# Patient Record
Sex: Female | Born: 1950 | Race: White | Hispanic: No | Marital: Married | Smoking: Former smoker
Health system: Southern US, Community
[De-identification: ages and names within clinical notes are randomized; demographics above are authoritative.]

## PROBLEM LIST (undated history)

## (undated) DIAGNOSIS — E890 Postprocedural hypothyroidism: Secondary | ICD-10-CM

## (undated) DIAGNOSIS — G4730 Sleep apnea, unspecified: Secondary | ICD-10-CM

## (undated) DIAGNOSIS — Z9889 Other specified postprocedural states: Secondary | ICD-10-CM

## (undated) DIAGNOSIS — M170 Bilateral primary osteoarthritis of knee: Secondary | ICD-10-CM

## (undated) DIAGNOSIS — J189 Pneumonia, unspecified organism: Secondary | ICD-10-CM

## (undated) DIAGNOSIS — M1711 Unilateral primary osteoarthritis, right knee: Secondary | ICD-10-CM

## (undated) DIAGNOSIS — Z85850 Personal history of malignant neoplasm of thyroid: Secondary | ICD-10-CM

## (undated) DIAGNOSIS — E079 Disorder of thyroid, unspecified: Secondary | ICD-10-CM

## (undated) DIAGNOSIS — R112 Nausea with vomiting, unspecified: Secondary | ICD-10-CM

## (undated) DIAGNOSIS — Z5189 Encounter for other specified aftercare: Secondary | ICD-10-CM

## (undated) DIAGNOSIS — D508 Other iron deficiency anemias: Secondary | ICD-10-CM

## (undated) DIAGNOSIS — Z974 Presence of external hearing-aid: Secondary | ICD-10-CM

## (undated) HISTORY — PX: BREAST CYST ASPIRATION: SHX578

## (undated) HISTORY — DX: Encounter for other specified aftercare: Z51.89

## (undated) HISTORY — DX: Disorder of thyroid, unspecified: E07.9

## (undated) HISTORY — DX: Other iron deficiency anemias: D50.8

## (undated) HISTORY — DX: Sleep apnea, unspecified: G47.30

## (undated) HISTORY — PX: BELPHAROPTOSIS REPAIR: SHX369

---

## 1996-09-26 HISTORY — PX: LAPAROSCOPIC CHOLECYSTECTOMY: SUR755

## 2012-06-30 ENCOUNTER — Ambulatory Visit: Payer: Self-pay | Admitting: Bariatrics

## 2012-07-07 ENCOUNTER — Ambulatory Visit: Payer: Self-pay | Admitting: Bariatrics

## 2012-07-07 DIAGNOSIS — Z01810 Encounter for preprocedural cardiovascular examination: Secondary | ICD-10-CM

## 2012-07-07 LAB — CBC WITH DIFFERENTIAL/PLATELET
Basophil #: 0.1 10*3/uL (ref 0.0–0.1)
Basophil %: 1 %
HCT: 37 % (ref 35.0–47.0)
Lymphocyte %: 20.8 %
MCH: 29.7 pg (ref 26.0–34.0)
MCHC: 34 g/dL (ref 32.0–36.0)
Monocyte #: 0.3 x10 3/mm (ref 0.2–0.9)
Neutrophil #: 3.7 10*3/uL (ref 1.4–6.5)
Platelet: 272 10*3/uL (ref 150–440)
RBC: 4.24 10*6/uL (ref 3.80–5.20)
RDW: 13.8 % (ref 11.5–14.5)

## 2012-07-07 LAB — COMPREHENSIVE METABOLIC PANEL
Alkaline Phosphatase: 104 U/L (ref 50–136)
Calcium, Total: 9.6 mg/dL (ref 8.5–10.1)
Co2: 24 mmol/L (ref 21–32)
Creatinine: 0.63 mg/dL (ref 0.60–1.30)
EGFR (African American): >60
EGFR (Non-African Amer.): >60
Glucose: 104 mg/dL — ABNORMAL HIGH (ref 65–99)
Potassium: 3.9 mmol/L (ref 3.5–5.1)
SGOT(AST): 16 U/L (ref 15–37)
SGPT (ALT): 20 U/L (ref 12–78)
Sodium: 142 mmol/L (ref 136–145)
Total Protein: 7.5 g/dL (ref 6.4–8.2)

## 2012-07-07 LAB — FOLATE: Folic Acid: 9.7 ng/mL (ref 3.1–100.0)

## 2012-07-07 LAB — BILIRUBIN, DIRECT: Bilirubin, Direct: 0.2 mg/dL (ref 0.00–0.20)

## 2012-07-07 LAB — IRON AND TIBC
Iron Saturation: 22 %
Iron: 63 ug/dL (ref 50–170)
Unbound Iron-Bind.Cap.: 226 ug/dL

## 2012-07-07 LAB — FERRITIN: Ferritin (ARMC): 152 ng/mL (ref 8–388)

## 2012-07-07 LAB — PROTIME-INR: INR: 1

## 2012-07-07 LAB — MAGNESIUM: Magnesium: 2 mg/dL

## 2012-07-07 LAB — LIPASE, BLOOD: Lipase: 81 U/L (ref 73–393)

## 2012-07-07 LAB — AMYLASE: Amylase: 32 U/L (ref 25–115)

## 2012-07-07 LAB — APTT: Activated PTT: 32.5 secs (ref 23.6–35.9)

## 2012-07-27 ENCOUNTER — Ambulatory Visit: Payer: Self-pay | Admitting: Bariatrics

## 2012-08-26 ENCOUNTER — Ambulatory Visit: Payer: Self-pay | Admitting: Bariatrics

## 2012-09-26 ENCOUNTER — Ambulatory Visit: Payer: Self-pay | Admitting: Bariatrics

## 2012-09-26 HISTORY — PX: ROUX-EN-Y GASTRIC BYPASS: SHX1104

## 2012-09-26 HISTORY — PX: BARIATRIC SURGERY: SHX1103

## 2012-10-14 ENCOUNTER — Ambulatory Visit: Payer: Self-pay | Admitting: Bariatrics

## 2012-10-19 ENCOUNTER — Inpatient Hospital Stay: Payer: Self-pay | Admitting: Bariatrics

## 2012-10-20 LAB — BASIC METABOLIC PANEL
BUN: 8 mg/dL (ref 7–18)
Calcium, Total: 9.7 mg/dL (ref 8.5–10.1)
Creatinine: 0.7 mg/dL (ref 0.60–1.30)
EGFR (Non-African Amer.): >60
Glucose: 111 mg/dL — ABNORMAL HIGH (ref 65–99)
Osmolality: 280 (ref 275–301)
Potassium: 3.7 mmol/L (ref 3.5–5.1)
Sodium: 141 mmol/L (ref 136–145)

## 2012-10-20 LAB — CBC WITH DIFFERENTIAL/PLATELET
Basophil #: 0 10*3/uL (ref 0.0–0.1)
Basophil %: 0.1 %
Eosinophil #: 0 10*3/uL (ref 0.0–0.7)
Eosinophil %: 0 %
HCT: 32.7 % — ABNORMAL LOW (ref 35.0–47.0)
HGB: 11.3 g/dL — ABNORMAL LOW (ref 12.0–16.0)
Lymphocyte #: 0.5 10*3/uL — ABNORMAL LOW (ref 1.0–3.6)
Lymphocyte %: 4 %
MCH: 29.8 pg (ref 26.0–34.0)
MCHC: 34.4 g/dL (ref 32.0–36.0)
MCV: 87 fL (ref 80–100)
Monocyte %: 3 %
Neutrophil #: 11.3 10*3/uL — ABNORMAL HIGH (ref 1.4–6.5)
Neutrophil %: 92.9 %
Platelet: 260 10*3/uL (ref 150–440)
RBC: 3.78 10*6/uL — ABNORMAL LOW (ref 3.80–5.20)
RDW: 13.9 % (ref 11.5–14.5)
WBC: 12.2 10*3/uL — ABNORMAL HIGH (ref 3.6–11.0)

## 2012-10-20 LAB — MAGNESIUM: Magnesium: 2 mg/dL

## 2012-10-21 LAB — PATHOLOGY REPORT

## 2012-11-16 ENCOUNTER — Ambulatory Visit: Payer: Self-pay | Admitting: Bariatrics

## 2013-04-14 ENCOUNTER — Other Ambulatory Visit: Payer: Self-pay | Admitting: Bariatrics

## 2013-04-14 LAB — COMPREHENSIVE METABOLIC PANEL
Albumin: 3.9 g/dL (ref 3.4–5.0)
Anion Gap: 6 — ABNORMAL LOW (ref 7–16)
BUN: 11 mg/dL (ref 7–18)
Bilirubin,Total: 0.7 mg/dL (ref 0.2–1.0)
Co2: 26 mmol/L (ref 21–32)
Creatinine: 0.65 mg/dL (ref 0.60–1.30)
EGFR (African American): >60
EGFR (Non-African Amer.): >60
Glucose: 95 mg/dL (ref 65–99)
Potassium: 3.8 mmol/L (ref 3.5–5.1)
SGOT(AST): 23 U/L (ref 15–37)
Sodium: 140 mmol/L (ref 136–145)
Total Protein: 7.4 g/dL (ref 6.4–8.2)

## 2013-04-14 LAB — MAGNESIUM: Magnesium: 2.1 mg/dL

## 2013-04-14 LAB — CBC WITH DIFFERENTIAL/PLATELET
Basophil %: 0.7 %
Eosinophil #: 0.1 10*3/uL (ref 0.0–0.7)
Eosinophil %: 1.3 %
HGB: 12.6 g/dL (ref 12.0–16.0)
Lymphocyte %: 16.1 %
MCH: 29.2 pg (ref 26.0–34.0)
MCHC: 33.4 g/dL (ref 32.0–36.0)
MCV: 88 fL (ref 80–100)
Monocyte %: 7.6 %
Neutrophil #: 4.4 10*3/uL (ref 1.4–6.5)
RBC: 4.3 10*6/uL (ref 3.80–5.20)
RDW: 15.5 % — ABNORMAL HIGH (ref 11.5–14.5)
WBC: 6 10*3/uL (ref 3.6–11.0)

## 2013-04-14 LAB — AMYLASE: Amylase: 35 U/L (ref 25–115)

## 2013-09-09 ENCOUNTER — Ambulatory Visit: Payer: Self-pay | Admitting: Obstetrics and Gynecology

## 2013-10-12 ENCOUNTER — Other Ambulatory Visit: Payer: Self-pay | Admitting: Bariatrics

## 2013-10-12 LAB — AMYLASE: Amylase: 40 U/L (ref 25–115)

## 2013-10-12 LAB — COMPREHENSIVE METABOLIC PANEL
ALT: 42 U/L (ref 12–78)
ANION GAP: 5 — AB (ref 7–16)
Albumin: 3.7 g/dL (ref 3.4–5.0)
Alkaline Phosphatase: 91 U/L
BILIRUBIN TOTAL: 0.6 mg/dL (ref 0.2–1.0)
BUN: 18 mg/dL (ref 7–18)
CALCIUM: 9.8 mg/dL (ref 8.5–10.1)
CHLORIDE: 105 mmol/L (ref 98–107)
CO2: 29 mmol/L (ref 21–32)
Creatinine: 0.68 mg/dL (ref 0.60–1.30)
Glucose: 82 mg/dL (ref 65–99)
OSMOLALITY: 279 (ref 275–301)
Potassium: 3.6 mmol/L (ref 3.5–5.1)
SGOT(AST): 32 U/L (ref 15–37)
Sodium: 139 mmol/L (ref 136–145)
TOTAL PROTEIN: 6.8 g/dL (ref 6.4–8.2)

## 2013-10-12 LAB — CBC WITH DIFFERENTIAL/PLATELET
Basophil #: 0 10*3/uL (ref 0.0–0.1)
Basophil %: 0.8 %
EOS PCT: 0.9 %
Eosinophil #: 0 10*3/uL (ref 0.0–0.7)
HCT: 38.1 % (ref 35.0–47.0)
HGB: 12.5 g/dL (ref 12.0–16.0)
LYMPHS ABS: 0.9 10*3/uL — AB (ref 1.0–3.6)
LYMPHS PCT: 22.6 %
MCH: 30.2 pg (ref 26.0–34.0)
MCHC: 32.7 g/dL (ref 32.0–36.0)
MCV: 92 fL (ref 80–100)
Monocyte #: 0.3 x10 3/mm (ref 0.2–0.9)
Monocyte %: 6.6 %
NEUTROS ABS: 2.8 10*3/uL (ref 1.4–6.5)
Neutrophil %: 69.1 %
PLATELETS: 188 10*3/uL (ref 150–440)
RBC: 4.13 10*6/uL (ref 3.80–5.20)
RDW: 15.1 % — AB (ref 11.5–14.5)
WBC: 4.1 10*3/uL (ref 3.6–11.0)

## 2013-10-12 LAB — IRON AND TIBC
IRON SATURATION: 32 %
IRON: 73 ug/dL (ref 50–170)
Iron Bind.Cap.(Total): 230 ug/dL — ABNORMAL LOW (ref 250–450)
Unbound Iron-Bind.Cap.: 157 ug/dL

## 2013-10-12 LAB — FOLATE: FOLIC ACID: 41.3 ng/mL (ref 3.1–100.0)

## 2013-10-12 LAB — PHOSPHORUS: Phosphorus: 2.4 mg/dL — ABNORMAL LOW (ref 2.5–4.9)

## 2013-10-12 LAB — FERRITIN: FERRITIN (ARMC): 172 ng/mL (ref 8–388)

## 2013-10-12 LAB — MAGNESIUM: MAGNESIUM: 2.3 mg/dL

## 2013-12-27 HISTORY — PX: TOTAL VAGINAL HYSTERECTOMY: SHX2548

## 2014-01-05 ENCOUNTER — Ambulatory Visit: Payer: Self-pay | Admitting: Obstetrics and Gynecology

## 2014-01-05 LAB — BASIC METABOLIC PANEL
ANION GAP: 0 — AB (ref 7–16)
BUN: 24 mg/dL — ABNORMAL HIGH (ref 7–18)
CREATININE: 0.62 mg/dL (ref 0.60–1.30)
Calcium, Total: 10.2 mg/dL — ABNORMAL HIGH (ref 8.5–10.1)
Chloride: 107 mmol/L (ref 98–107)
Co2: 33 mmol/L — ABNORMAL HIGH (ref 21–32)
EGFR (Non-African Amer.): >60
GLUCOSE: 84 mg/dL (ref 65–99)
Osmolality: 283 (ref 275–301)
Potassium: 4.6 mmol/L (ref 3.5–5.1)
Sodium: 140 mmol/L (ref 136–145)

## 2014-01-05 LAB — CBC
HCT: 37.3 % (ref 35.0–47.0)
HGB: 12 g/dL (ref 12.0–16.0)
MCH: 30.4 pg (ref 26.0–34.0)
MCHC: 32.2 g/dL (ref 32.0–36.0)
MCV: 94 fL (ref 80–100)
PLATELETS: 215 10*3/uL (ref 150–440)
RBC: 3.96 10*6/uL (ref 3.80–5.20)
RDW: 13.9 % (ref 11.5–14.5)
WBC: 5 10*3/uL (ref 3.6–11.0)

## 2014-01-11 ENCOUNTER — Ambulatory Visit: Payer: Self-pay | Admitting: Obstetrics and Gynecology

## 2014-01-11 LAB — HEMATOCRIT: HCT: 35.8 % (ref 35.0–47.0)

## 2014-01-12 LAB — CBC WITH DIFFERENTIAL/PLATELET
Basophil #: 0 10*3/uL (ref 0.0–0.1)
Basophil %: 0.2 %
EOS ABS: 0 10*3/uL (ref 0.0–0.7)
Eosinophil %: 0.1 %
HCT: 28.8 % — ABNORMAL LOW (ref 35.0–47.0)
HGB: 9.6 g/dL — ABNORMAL LOW (ref 12.0–16.0)
Lymphocyte #: 0.9 10*3/uL — ABNORMAL LOW (ref 1.0–3.6)
Lymphocyte %: 7.3 %
MCH: 31.1 pg (ref 26.0–34.0)
MCHC: 33.5 g/dL (ref 32.0–36.0)
MCV: 93 fL (ref 80–100)
Monocyte #: 0.9 x10 3/mm (ref 0.2–0.9)
Monocyte %: 7.6 %
Neutrophil #: 9.9 10*3/uL — ABNORMAL HIGH (ref 1.4–6.5)
Neutrophil %: 84.8 %
Platelet: 199 10*3/uL (ref 150–440)
RBC: 3.09 10*6/uL — AB (ref 3.80–5.20)
RDW: 14.2 % (ref 11.5–14.5)
WBC: 11.6 10*3/uL — ABNORMAL HIGH (ref 3.6–11.0)

## 2014-01-12 LAB — BASIC METABOLIC PANEL
ANION GAP: 6 — AB (ref 7–16)
BUN: 18 mg/dL (ref 7–18)
Calcium, Total: 9.1 mg/dL (ref 8.5–10.1)
Chloride: 108 mmol/L — ABNORMAL HIGH (ref 98–107)
Co2: 26 mmol/L (ref 21–32)
Creatinine: 1.08 mg/dL (ref 0.60–1.30)
EGFR (Non-African Amer.): 55 — ABNORMAL LOW
Glucose: 155 mg/dL — ABNORMAL HIGH (ref 65–99)
Osmolality: 284 (ref 275–301)
Potassium: 4.9 mmol/L (ref 3.5–5.1)
SODIUM: 140 mmol/L (ref 136–145)

## 2014-01-13 LAB — BASIC METABOLIC PANEL
ANION GAP: 2 — AB (ref 7–16)
BUN: 18 mg/dL (ref 7–18)
CALCIUM: 9.6 mg/dL (ref 8.5–10.1)
CHLORIDE: 107 mmol/L (ref 98–107)
CO2: 30 mmol/L (ref 21–32)
CREATININE: 1.26 mg/dL (ref 0.60–1.30)
EGFR (African American): 53 — ABNORMAL LOW
EGFR (Non-African Amer.): 46 — ABNORMAL LOW
Glucose: 103 mg/dL — ABNORMAL HIGH (ref 65–99)
OSMOLALITY: 280 (ref 275–301)
Potassium: 4.1 mmol/L (ref 3.5–5.1)
Sodium: 139 mmol/L (ref 136–145)

## 2014-01-15 LAB — PATHOLOGY REPORT

## 2014-04-19 ENCOUNTER — Ambulatory Visit: Payer: Self-pay | Admitting: Surgical

## 2014-04-19 LAB — CBC WITH DIFFERENTIAL/PLATELET
Basophil #: 0 10*3/uL (ref 0.0–0.1)
Basophil %: 1.1 %
EOS ABS: 0 10*3/uL (ref 0.0–0.7)
EOS PCT: 1.3 %
HCT: 36.7 % (ref 35.0–47.0)
HGB: 12 g/dL (ref 12.0–16.0)
LYMPHS ABS: 0.9 10*3/uL — AB (ref 1.0–3.6)
Lymphocyte %: 26 %
MCH: 30.8 pg (ref 26.0–34.0)
MCHC: 32.8 g/dL (ref 32.0–36.0)
MCV: 94 fL (ref 80–100)
MONO ABS: 0.2 x10 3/mm (ref 0.2–0.9)
MONOS PCT: 6.4 %
NEUTROS ABS: 2.2 10*3/uL (ref 1.4–6.5)
NEUTROS PCT: 65.2 %
Platelet: 181 10*3/uL (ref 150–440)
RBC: 3.91 10*6/uL (ref 3.80–5.20)
RDW: 13.3 % (ref 11.5–14.5)
WBC: 3.4 10*3/uL — ABNORMAL LOW (ref 3.6–11.0)

## 2014-04-19 LAB — COMPREHENSIVE METABOLIC PANEL
ALK PHOS: 94 U/L
Albumin: 3.7 g/dL (ref 3.4–5.0)
Anion Gap: 6 — ABNORMAL LOW (ref 7–16)
BUN: 18 mg/dL (ref 7–18)
Bilirubin,Total: 0.5 mg/dL (ref 0.2–1.0)
CALCIUM: 9.7 mg/dL (ref 8.5–10.1)
Chloride: 108 mmol/L — ABNORMAL HIGH (ref 98–107)
Co2: 28 mmol/L (ref 21–32)
Creatinine: 0.62 mg/dL (ref 0.60–1.30)
EGFR (Non-African Amer.): >60
Glucose: 87 mg/dL (ref 65–99)
OSMOLALITY: 284 (ref 275–301)
Potassium: 4.2 mmol/L (ref 3.5–5.1)
SGOT(AST): 34 U/L (ref 15–37)
SGPT (ALT): 66 U/L — ABNORMAL HIGH
Sodium: 142 mmol/L (ref 136–145)
Total Protein: 7 g/dL (ref 6.4–8.2)

## 2014-04-19 LAB — PHOSPHORUS: Phosphorus: 2.8 mg/dL (ref 2.5–4.9)

## 2014-04-19 LAB — FOLATE: FOLIC ACID: 30.3 ng/mL — AB (ref 3.1–17.5)

## 2014-04-19 LAB — MAGNESIUM: Magnesium: 2.1 mg/dL

## 2014-04-19 LAB — AMYLASE: Amylase: 42 U/L (ref 25–115)

## 2014-04-19 LAB — FERRITIN: Ferritin (ARMC): 59 ng/mL (ref 8–388)

## 2014-04-19 LAB — IRON: Iron: 64 ug/dL (ref 50–170)

## 2014-04-20 ENCOUNTER — Ambulatory Visit: Payer: Self-pay | Admitting: Bariatrics

## 2014-08-18 NOTE — Op Note (Signed)
PATIENT NAME:  Tracey Jensen, Tracey Jensen MR#:  [id number] DATE OF BIRTH:  [birth year]  DATE OF PROCEDURE:  10/19/2012  PROCEDURE PERFORMED: Laparoscopic gastric bypass with intraoperative endoscopy.  PREOPERATIVE DIAGNOSIS: Body mass index of 53 associated with obstructive sleep apnea.   POSTOPERATIVE DIAGNOSIS: Body mass index of 53 associated with obstructive sleep apnea.   PHYSICIANS IN ATTENDANCE: Casimiro Needle A. Alva Garnet, MD.  ASSISTANTS: Lewis Moccasin, PA-C. Lollie Sails was consulted to assist as I was unable to obtain assistance from my routine PA.   DESCRIPTION OF PROCEDURE: The patient was brought to the operating room and placed in supine position. General anesthesia obtained with orotracheal intubation. Foley catheter inserted sterilely. TED hose and Thromboguards applied. A footboard applied at the end of the operative bed. The abdomen was sterilely prepped and draped. A 5 mm trocar introduced under direct visualization in the left upper quadrant of the abdomen. Pneumoperitoneum obtained with carbon dioxide. Four additional trocars were introduced across the upper abdomen. The patient had elevation of the omentum, which was then divided in the mid transverse colon area. The ligament of Treitz was identified, and the small bowel was followed distally 50 cm, at which point the small bowel was introduced into the left upper quadrant and secured to the anterior gastric wall for future use. The patient then had elevation of the left lobe of the liver. The patient, on inspection, failed to have any identifiable hiatal hernia. A portion of the fat pad overlying the angle of His was mobilized from both the anterior stomach and the undersurface of the left hemidiaphragm. The fundus was also mobilized from the left hemidiaphragm. Again, no hiatal hernia identified. The patient had division of the arcade vessels approximately 5 cm inferior to the GE junction. This was done along the lesser curvature of the stomach with division of  the arcade vessels by use of the Harmonic scalpel. The division of the proximal gastric pouch was then performed with use of gold load GIA stapler reinforced with Seamguard. The first line of staples was placed in a transverse direction. Next, a vertical line of staples was placed parallel to the lesser curvature and brought out just lateral to the angle of His, creating a relatively small gastric pouch. The mobilized jejunum was then secured to the posterior stomach wall and enterotomy made on the distal posterior aspect of the gastric pouch and opposing portion of the antimesenteric border of the jejunum. A blue load stapler was fired at the 2 cm mark to initiate creation of a proximal gastric pouch. The resulting enterotomy was then closed with a running 2-0 Polysorb suture. The suture and staple line reinforced with an additional running 2-0 Polysorb suture. This latter was performed with a 34 French bougie in place used as an aid in sizing of the anastomotic lumen. Immediately proximal to this, the patient had entry into the mesentery immediately adjacent to the small bowel, and a white load stapler was used to transect the jejunum, creating a biliary limb. The Roux limb was then followed distally 125 cm, at which point a side-to-side jejunal/jejunal anastomosis was configured. This was accomplished with enterotomies on the antimesenteric border of the biliary limb and on the common channel portion of the jejunum. There was a side entry into the mucosa made adjacent to the initial entry site into the common channel. A small portion of mucosa was excised. A white load stapler was used to create a common lumen at this point, and the resulting enterotomy closed with a repeat  firing of white load GIA stapler. On inspection, there was a relative narrowing of the jejunum leading into the anastomosis presumably because of this small ellipse of mucosa excised, and while there appeared to be adequate spacing on the  posterior aspect of the jejunum, it was my concern this might represent a relative obstruction, and it was felt that this anastomosis could potentially lead to dysfunction in the future, and it was decided to resect this. The jejunum immediately proximal to the jejunal/jejunal anastomosis was transected with a white load stapler, the arcade vessels associated with the area of the anastomosis divided with the use of the Harmonic scalpel, and the bowel immediately distal to it was transected as well, leaving a small segment of bowel to be removed. A side-to-side anastomosis was then created with the Roux limb in the bowel more distal to this, and was done with again a white load GIA stapler, creating a common lumen, and closure of the resulting enterotomy with a repeat firing of white load stapler. The small arcade mesenteric window was closed with a running 2-0 Surgidac suture. The biliary limb addressed, and again a small aspect of the bowel was resected after division of the arcade vessels with use of the Harmonic scalpel. This included full exclusion of the initial stapled anastomosis. Next, a new biliary jejunal anastomosis was created. This was performed approximately 10 cm distal to the initial anastomosis. This was accomplished in a similar fashion with an enterotomy on the antimesenteric border of the biliary limb and on the common channel portion of jejunum. A common lumen created with a white load GIA stapler, followed by closure of the enterotomy with a repeat firing of white load stapler. Anti-torsion sutures placed distal to the anastomosis, and the mesenteric window closed with a running 2-0 Surgidac suture. At this point, the patient had occlusion of the jejunum distal gastrojejunal anastomosis and endoscopy performed. There was no evidence of an air leak noted in the region of the anastomosis. The suture line covered with Enseal protein glue and then covered with omental patch, with delivery of the 2  divided limbs of the omentum over the area of the anastomosis. This was secured to the jejunum immediately distal to the anastomosis. Peterson defect closed with a running 2-0 Surgidac suture. The anastomoses inspected and hemostasis confirmed. The pneumoperitoneum relieved, the trocars removed. The wounds injected with 0.25% Marcaine, followed by 4-0 Monocryl to the dermis, followed by Dermabond. The patient was allowed to recover, having tolerated the procedure well with minimal blood loss.   ____________________________ Tyrone AppleMichael A. Alva Garnetyner, MD mat:OSi D: 10/20/2012 10:02:00 ET T: 10/20/2012 11:27:12 ET JOB#: [id number]  cc: Casimiro NeedleMichael A. Alva Garnetyner, MD, <Dictator> Everette RankMICHAEL A Peggyann Zwiefelhofer MD ELECTRONICALLY SIGNED 11/16/2012 15:44

## 2014-08-19 NOTE — Op Note (Signed)
PATIENT NAME:  Tracey Jensen, Tracey Jensen MR#:  [id number] DATE OF BIRTH:  [birth year]  DATE OF PROCEDURE:  01/11/2014   PREOPERATIVE DIAGNOSIS:  Procidentia.   POSTOPERATIVE DIAGNOSES:  1.  Procidentia.  2.  Periurethral abscess.  3.  Bilateral hydronephrosis.   OPERATIVE PROCEDURES:  1.  Transvaginal hysterectomy, bilateral salpingo-oophorectomy.  2.  Anterior, posterior colporrhaphy.  3.  Cystoscopy with ureteral stent placement under fluoroscopy (Dr. Apolinar Junes). 4.  Pubovaginal sling, aborted (Dr. Apolinar Junes).    INDICATIONS: The patient is a 64 year old white female with procidentia who presents for definitive surgical management.   FINDINGS AT SURGERY:  Included procidentia with total vault eversion.  On cystoscopy, anatomy distorted due to prolapse.  Bilateral hydronephrosis was noted during ureteral stent placement. A 2 cm right periurethral abscess was identified during dissection for pubovaginal sling placement. Because of purulent drainage from the cyst, the procedure was aborted in order to avoid placement of graft tissue into a potentially infected area.   DESCRIPTION OF PROCEDURE: The patient was brought to the Operating Room where she was placed in the supine position. General endotracheal anesthesia was induced without difficulty. She was placed in the dorsal lithotomy position using the bumblebee stirrups. The cystoscopy was performed and ureteral stent placement was done bilaterally under fluoroscopic guidance. Please see Dr. Delana Meyer note for details.  Following placement of the stents and a Foley catheter, the patient was repositioned and re-prepped and draped in standard fashion. The patient was placed in the candy cane stirrups for the vaginal surgery.  A weighted speculum was placed into the vagina.  A double-tooth tenaculum was placed onto the cervix and a posterior colpotomy was attempted with Mayo scissors.  The initial attempt at gaining entry to the cul-de-sac was unsuccessful due to  anatomic distortion.  Gradually with the continuing procedure, colpotomy was made.  The uterosacral ligaments were clamped, cut, and stick tied using 0 Vicryl suture.  The cervix was circumscribed with the Bovie cautery and the bladder was dissected off the lower uterine segment through sharp and blunt dissection.  Eventually, the anterior colpotomy was made.  Sequentially the cardinal broad ligament complexes were clamped, cut, and stick tied using 0 Vicryl sutures.  This was done bilaterally up to the level of the utero-ovarian ligaments which were then crossclamped, cut, and stick tied.  The adnexa were identified respectively and grasped with Babcock clamps.  The infundibulopelvic ligament was isolated and crossclamped using curved Heaney clamp.  The tube and ovary were excised from the operative field.  A free tie was placed on the pedicle, followed by a stick tie of 0 Vicryl suture. Good hemostasis was obtained.  A similar procedure was carried out on the contralateral adnexa. The cul-de-sac was then closed using a pursestring stitch of 0 Vicryl suture. This was followed by the anterior colporrhaphy procedure.   The angles of the vagina were grasped with Allis clamps.  The vaginal mucosa was undermined using Metzenbaum scissors and incised in the midline.  The Allis-Adair retractors were used to facilitate grasping of the vaginal mucosa.  This was then dissected off the pubovesical cervical fascia through sharp and blunt dissection.  The dissection was very tedious due to scarring from the chronic relaxation process.  Eventually the bladder was mobilized from the vaginal mucosa. The cystocele was reduced using 0 Vicryl sutures in a vertical mattress technique.  The cystocele was reduced nicely.  The vaginal mucosa was trimmed with a moderate amount of tissue being removed.  The vaginal mucosa  was then reapproximated in the midline using 2-0 chromic sutures in a simple interrupted manner.  Following  completion of the cystocele repair, attention was turned to the pubovaginal sling.   Please see details of the pubovaginal sling surgery done by Dr. Apolinar JunesBrandon. The procedure was aborted because of identification of a periurethral cystic abscess. The vaginal mucosa was closed.   Posterior colporrhaphy was then performed in standard fashion. Allis clamps were used to grasp the lateral aspects of the vaginal mucosa at the introitus posteriorly.  A diamond-shaped wedge of tissue from the perineum was excised using a scalpel.  The posterior vaginal mucosa was then undermined with Metzenbaum scissors and then incised in the midline.  Allis-Adair retractors were used to facilitate exposure.  The perirectal fascia was dissected off of the vaginal mucosa to expose the rectocele.  The rectocele was then closed using horizontal mattress sutures of 0 Vicryl.  A good shelf was developed.  During this process however, the peritoneum was entered, this was explored, and no adhesions were identified on palpation and the peritoneal cavity was then closed again with a pursestring suture of 0 Vicryl.  The remainder of the rectocele was completed using interrupted sutures of 2-0 Vicryl in a horizontal mattress technique.  Excess vaginal mucosa was trimmed and the vagina was closed using 2-0 chromic suture in a simple interrupted manner.  The perineum, including the perineal body, was reapproximated using 0 Vicryl sutures.  This was followed by the closure of the posterior fourchette with simple interrupted sutures of 2-0 chromic.  Depth of the vagina was noted to be adequate.  Vaginal mucosa, although trimmed, was redundant with a slight excess.  However, this was not removed because of concern for foreshortening of the vagina.  A vaginal pack was placed with Premarin cream used as lubricant.  The patient was then awakened, extubated, mobilized, and taken to the recovery room in satisfactory condition.  IV fluids infused were 2 liters  of crystalloid. The patient also received 1 unit of packed red blood cells.   ESTIMATED BLOOD LOSS: 1200 mL.   URINE OUTPUT: 900 mL.  All instruments, needle, and sponge counts were verified as correct.  The patient did receive Ancef antibiotic prophylaxis x 2 doses due to the prolonged duration of the procedure.  No complications were encountered.      ____________________________ Prentice DockerMartin A. Laiba Fuerte, MD mad:DT D: 01/13/2014 13:00:40 ET T: 01/13/2014 13:54:58 ET JOB#: [id number]  cc: Daphine DeutscherMartin A. Laqueshia Cihlar, MD, <Dictator> [facility] Prentice DockerMARTIN A Hailei Besser MD ELECTRONICALLY SIGNED 01/16/2014 7:56

## 2014-08-19 NOTE — Op Note (Signed)
PATIENT NAME:  Tracey Jensen, Tracey Jensen MR#:  [id number] DATE OF BIRTH:  [birth year]  DATE OF PROCEDURE:  01/11/2014  PREOPERATIVE DIAGNOSES:  Pelvic organ prolapse, stress urinary incontinence.  POSTOPERATIVE DIAGNOSES:  Pelvic organ prolapse, stress urinary incontinence.  PROCEDURES PERFORMED:  Cystoscopy, bilateral retrograde pyelogram, bilateral ureteral catheter stent placement, interpretation of fluoroscopy less than 30 minutes, attempted/ aborted pubovaginal sling placement [facility]).  ATTENDING SURGEON:  Sherlynn Stalls, MD  ANESTHESIA:  General anesthesia.  ESTIMATED BLOOD LOSS:  Please see dictated note from Dr. DeFrancesco/Cherry for details.   SPECIMEN:  From urologic portion of the procedure is none.   COMPLICATIONS:  Unable to place midurethral sling due to presence of purulent periurethral collection.  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 She was initially seen in consultation via [facility] Dr. Enzo Bi, who recommended transvaginal hysterectomy and salpingo-oophorectomy with primary anterior and posterior repairs. She was also seen in consultation by myself given her history of fairly significant stress urinary incontinence and counsel to undergo midurethral sling placement as a concomitant procedure. We discussed extensively the risks of mesh including risk of mesh infection, extrusion, erosion, urinary retention, and failure of the sling. She was also counseled to undergo cystoscopy with bilateral ureteral stent placement for assistance with identification of the ureters during this complex repair. She agreed to proceed with planned procedure.   PROCEDURE IN DETAIL:  The patient was correctly identified in the preoperative holding area, and informed consent was confirmed. She was brought to the operating suite and placed on the table in the supine position. At this time, a universal  timeout protocol was performed. All team members were identified. Venodyne boots were placed, and she was administered 2 grams of IV Ancef in the perioperative period. She was then repositioned lower on the bed to accommodate the C-arm and then repositioned in the dorsal lithotomy position. She was prepped and draped in standard surgical fashion.  At this point in time, a rigid cystoscope was advanced per urethra into the bladder. Of note, at this point in time her prolapse was not reduced, and there was significant distortion of her anatomy and her trigone was fairly difficult to identify. Upon reduction of her severe cystocele, the trigone was able to be visualized with a 30-degree lens.  There was some edema and mild inflammatory changes at the trigone noted. Attention was turned to the right ureteral orifice, which was cannulated using a 5-French open-ended ureteral orifice just within the UL. At this point in time, the C-arm was used to perform a retrograde pyelogram. This revealed deviation of the distal ureter as well as moderate hydroureteronephrosis. A wire was then passed up the ureter to the level of the renal pelvis, confirmed under fluoroscopy. The 5-French open-ended ureteral stent was advanced over this wire, up to the level of the proximal ureter. The wire was removed, leaving the open-ended stent in place. The same exact procedure was performed on the left-hand side, first cannulating the left UO with the 5-French open-ended ureteral catheter. A retrograde pyelogram was confirmed, again revealing moderate hydroureteronephrosis down to the level of the bladder with some distortion of the distal ureteral anatomy due to the profound prolapse of her bladder. The open-ended ureteral stent was then advanced over the wire to the level of the proximal ureter. When the scope was removed, the left stent was cut at a 45-degree angle to differentiate between the left and  right. A 15-French Foley catheter was  then passed into the bladder and the balloon was inflated with 10 mL of sterile water. Each ureteral catheter was then tied to the Foley using 0 silk ties. Bile bags were attached to each of the open-ended ureteral catheters to monitor urine output throughout the case.   At this point in time, the patient was repositioned and Dr. Enzo Bi performed a transvaginal hysterectomy and bilateral salpingo-oophorectomy as well as anterior and posterior repair, for which I was his assistant. Please see his dictation for this portion of the procedure.   Once the anterior anatomy had been repaired and reduced back into its normal anatomical position, I then attempted to place a pubovaginal sling using the [facility] kit. First, the urethral meatus was retracted slightly superiorly, and hydrodissection was performed at the level of the bladder neck using 1% lidocaine with epinephrine. Approximately, a 2 cm long incision was made below the urethral meatus in the midline. The dissection was then carried out in the tissue plane on the patient's left-hand side just lateral to the urethra, towards the pubic bone so the pubic bone was easily palpable. The same dissection and plane was then attempted on the right side; however, upon entering the space, a fluid pocket of purulent discharge was encountered, most likely representing an infected periurethral cyst or gland. Given the presence of purulent discharge at the site of the planned mesh placement, the decision was made not to proceed with mesh placement given concern for mesh infection. This area was then copiously irrigated after the abscess was completely drained. The vaginal mucosal incision was then closed using a series of interrupted 3-0 chromic sutures. At this point, Dr. Enzo Bi completed his portion of the procedure, and the patient was ultimately reversed from anesthesia and taken to the PACU in stable condition.    ____________________________ Sherlynn Stalls, MD ajb:nb D: 01/11/2014 21:18:46 ET T: 01/11/2014 21:58:42 ET JOB#: [id number]  cc: Sherlynn Stalls, MD, <Dictator> Sherlynn Stalls MD ELECTRONICALLY SIGNED 02/05/2014 8:39

## 2014-09-06 ENCOUNTER — Other Ambulatory Visit: Payer: Self-pay | Admitting: Obstetrics and Gynecology

## 2014-09-06 DIAGNOSIS — Z1231 Encounter for screening mammogram for malignant neoplasm of breast: Secondary | ICD-10-CM

## 2014-09-15 ENCOUNTER — Ambulatory Visit
Admission: RE | Admit: 2014-09-15 | Discharge: 2014-09-15 | Disposition: A | Discharge status: Home / Self Care | Payer: 59 | Source: Ambulatory Visit | Attending: Obstetrics and Gynecology | Admitting: Obstetrics and Gynecology

## 2014-09-15 DIAGNOSIS — Z1231 Encounter for screening mammogram for malignant neoplasm of breast: Secondary | ICD-10-CM | POA: Diagnosis not present

## 2014-10-03 ENCOUNTER — Ambulatory Visit (INDEPENDENT_AMBULATORY_CARE_PROVIDER_SITE_OTHER): Payer: 59 | Admitting: Family Medicine

## 2014-10-03 ENCOUNTER — Encounter: Payer: Self-pay | Admitting: Family Medicine

## 2014-10-03 VITALS — BP 122/74 | HR 110 | Temp 98.2°F | Resp 16 | Ht 66.0 in | Wt 166.2 lb

## 2014-10-03 DIAGNOSIS — Z90722 Acquired absence of ovaries, bilateral: Secondary | ICD-10-CM | POA: Diagnosis not present

## 2014-10-03 DIAGNOSIS — Z79890 Hormone replacement therapy (postmenopausal): Secondary | ICD-10-CM | POA: Diagnosis not present

## 2014-10-03 DIAGNOSIS — D508 Other iron deficiency anemias: Secondary | ICD-10-CM | POA: Diagnosis not present

## 2014-10-03 DIAGNOSIS — Z9079 Acquired absence of other genital organ(s): Secondary | ICD-10-CM

## 2014-10-03 DIAGNOSIS — K5909 Other constipation: Secondary | ICD-10-CM | POA: Insufficient documentation

## 2014-10-03 DIAGNOSIS — Z9884 Bariatric surgery status: Secondary | ICD-10-CM

## 2014-10-03 DIAGNOSIS — Z23 Encounter for immunization: Secondary | ICD-10-CM | POA: Diagnosis not present

## 2014-10-03 DIAGNOSIS — Z90710 Acquired absence of both cervix and uterus: Secondary | ICD-10-CM | POA: Diagnosis not present

## 2014-10-03 DIAGNOSIS — Z13220 Encounter for screening for lipoid disorders: Secondary | ICD-10-CM

## 2014-10-03 HISTORY — DX: Other iron deficiency anemias: D50.8

## 2014-10-03 NOTE — Progress Notes (Signed)
Name: Tracey Jensen   MRN: [id number]    DOB: 10-Jan-1951   Date:10/03/2014       Progress Note  Subjective  Chief Complaint  Chief Complaint  Patient presents with  . Establish Care    HPI  Tracey Jensen is a new patient to the practice and reports to concerns today.    Patient Active Problem List   Diagnosis Date Noted  . Chronic constipation 10/03/2014  . Post-menopause on HRT (hormone replacement therapy) 10/03/2014  . S/P TAH-BSO (total abdominal hysterectomy and bilateral salpingo-oophorectomy) 10/03/2014  . Bariatric surgery status 10/03/2014  . Iron deficiency anemia due to dietary causes 10/03/2014    History  Substance Use Topics  . Smoking status: Former Games developer  . Smokeless tobacco: Not on file  . Alcohol Use: No     Current outpatient prescriptions:  .  b complex vitamins capsule, Take 1 capsule by mouth daily., Disp: , Rfl:  .  Calcium Carb-Cholecalciferol (CALCIUM 1000 + D PO), Take 1,000 mg by mouth daily., Disp: , Rfl:  .  docusate sodium (COLACE) 100 MG capsule, Take 100 mg by mouth 2 (two) times daily., Disp: , Rfl:  .  estradiol (ESTRACE) 1 MG tablet, Take 1 mg by mouth daily., Disp: , Rfl: 0 .  estrogens, conjugated, (PREMARIN) 0.45 MG tablet, Take 0.5 mg by mouth 2 (two) times a week. Take daily for 21 days then do not take for 7 days., Disp: , Rfl:  .  Ferrous Gluconate (IRON) 240 (27 FE) MG TABS, Take 27 mcg by mouth daily., Disp: , Rfl:  .  vitamin B-12 (CYANOCOBALAMIN) 500 MCG tablet, Take 500 mcg by mouth daily., Disp: , Rfl:   Past Surgical History  Procedure Laterality Date  . Total vaginal hysterectomy  12/2013  . Bariatric surgery  09/2012  . Laparoscopic cholecystectomy  09/1996    Family History  Problem Relation Age of Onset  . Arthritis Mother   . Hyperlipidemia Mother   . Cancer Father   . Hyperlipidemia Father   . Vision loss Father   . Arthritis Maternal Grandmother   . Stroke Maternal Grandmother   . Cancer Paternal  Grandmother   . Cancer Paternal Grandfather   . Stroke Paternal Grandfather     No Known Allergies   Review of Systems  CONSTITUTIONAL: No significant weight changes, fever, chills, weakness or fatigue.  HEENT:  - Eyes: No visual changes.  - Ears: No auditory changes. No pain.  - Nose: No sneezing, congestion, runny nose. - Throat: No sore throat. No changes in swallowing. SKIN: No rash or itching.  CARDIOVASCULAR: No chest pain, chest pressure or chest discomfort. No palpitations or edema.  RESPIRATORY: No shortness of breath, cough or sputum.  GASTROINTESTINAL: No anorexia, nausea, vomiting. No changes in bowel habits. No abdominal pain or blood.  GENITOURINARY: No dysuria. No frequency. No discharge.  NEUROLOGICAL: No headache, dizziness, syncope, paralysis, ataxia, numbness or tingling in the extremities. No memory changes. No change in bowel or bladder control.  MUSCULOSKELETAL: No joint pain. No muscle pain. HEMATOLOGIC: No anemia, bleeding or bruising.  LYMPHATICS: No enlarged lymph nodes.  PSYCHIATRIC: No change in mood. No change in sleep pattern.  ENDOCRINOLOGIC: No reports of sweating, cold or heat intolerance. No polyuria or polydipsia.      Objective  BP 122/74 mmHg  Pulse 110  Temp(Src) 98.2 F (36.8 C) (Oral)  Resp 16  Ht  (1.676 m)  Wt 166 lb 3.2 oz (75.388 kg)  BMI 26.84 kg/m2  SpO2 97%  Physical Exam  Constitutional: Patient appears well-developed and well-nourished. In no distress.   Neck: Normal range of motion. Neck supple. No JVD present. No thyromegaly present.  Cardiovascular: Normal rate, regular rhythm and normal heart sounds.  No murmur heard.  Pulmonary/Chest: Effort normal and breath sounds normal. No respiratory distress. Musculoskeletal: Normal range of motion bilateral UE and LE, no joint effusions. Peripheral vascular: Bilateral LE no edema. Neurological: CN II-XII grossly intact with no focal deficits. Alert and oriented to  person, place, and time. Coordination, balance, strength, speech and gait are normal.  Skin: Skin is warm and dry. No rash noted. No erythema.  Psychiatric: Patient has a normal mood and affect. Behavior is normal in office today. Judgment and thought content normal in office today.   Assessment & Plan  1. Post-menopause on HRT (hormone replacement therapy) Doing well on hormone replacement managed by Gynecologist. Discussed colon cancer screening options. She will look into Cologuard.   - TSH  2. S/P TAH-BSO (total abdominal hysterectomy and bilateral salpingo-oophorectomy) Doing well on hormone replacement managed by Gynecologist.  3. Bariatric surgery status Has lost about 175lbs. Followed by Dr. Alva Garnetyner.  4. Iron deficiency anemia due to dietary causes Plan on doing routine post bariatric labs with Dr. Alva Garnetyner.  5. Screening cholesterol level  - Lipid Profile  6. Need for diphtheria-tetanus-pertussis (Tdap) vaccine, adult/adolescent  - Tdap vaccine greater than or equal to 7yo IM

## 2014-10-05 ENCOUNTER — Telehealth: Payer: Self-pay | Admitting: Family Medicine

## 2014-10-05 NOTE — Telephone Encounter (Signed)
Contacted this patient in regards to her message from earlier. Patient was informed that she could do a 3-set hemoccult test instead since her insurance would not cover the color guard test. Patient then informed me that she had received the fecal occult test from Dr. Greggory Keen, so I encouraged her to do that one and that would give [place] the results we needed. She said ok and thanks

## 2014-10-05 NOTE — Telephone Encounter (Signed)
Pt checked with her insurance and they do not cover the cologuard test.  Pt would like to know another possible option.

## 2014-10-06 LAB — LIPID PANEL
Chol/HDL Ratio: 2.3 ratio units (ref 0.0–4.4)
Cholesterol, Total: 176 mg/dL (ref 100–199)
HDL: 77 mg/dL (ref >39)
LDL CALC: 88 mg/dL (ref 0–99)
TRIGLYCERIDES: 53 mg/dL (ref 0–149)
VLDL Cholesterol Cal: 11 mg/dL (ref 5–40)

## 2014-10-06 LAB — TSH: TSH: 3.32 u[IU]/mL (ref 0.450–4.500)

## 2014-11-20 ENCOUNTER — Telehealth: Payer: Self-pay | Admitting: Family Medicine

## 2014-11-20 NOTE — Telephone Encounter (Signed)
Please let patient know that I would like her to follow up in my office for a surgical clearance appointment as I need updated blood work and EKG before I can send a clearance letter to [facility].

## 2014-11-20 NOTE — Telephone Encounter (Signed)
Pt coming in on Aug 1st at 2:15 for surgical clearence

## 2014-11-27 ENCOUNTER — Encounter: Payer: Self-pay | Admitting: Family Medicine

## 2014-11-27 ENCOUNTER — Ambulatory Visit (INDEPENDENT_AMBULATORY_CARE_PROVIDER_SITE_OTHER): Payer: 59 | Admitting: Family Medicine

## 2014-11-27 VITALS — BP 118/62 | HR 98 | Temp 98.6°F | Resp 16 | Wt 168.6 lb

## 2014-11-27 DIAGNOSIS — Z79890 Hormone replacement therapy (postmenopausal): Secondary | ICD-10-CM

## 2014-11-27 DIAGNOSIS — D508 Other iron deficiency anemias: Secondary | ICD-10-CM

## 2014-11-27 NOTE — Progress Notes (Signed)
Name: Tracey Jensen   MRN: [id number]    DOB: 04/06/1951   Date:11/27/2014       Progress Note  Subjective  Chief Complaint  Chief Complaint  Patient presents with  . Medical Clearance    patient is here for surgical clearance.    HPI  [name] is a pleasant 64 year old female with no significant chronic medical issues who is here today for surgical clearance prior to elective tummy tuck surgery with [facility]. She has had bariatric surgery in the past with no complications. She reports to shortness of breath with regular activity, chest pain, palpitations or paresthesias.   Patient Active Problem List   Diagnosis Date Noted  . Chronic constipation 10/03/2014  . Post-menopause on HRT (hormone replacement therapy) 10/03/2014  . S/P TAH-BSO (total abdominal hysterectomy and bilateral salpingo-oophorectomy) 10/03/2014  . Bariatric surgery status 10/03/2014  . Iron deficiency anemia due to dietary causes 10/03/2014    History  Substance Use Topics  . Smoking status: Former Games developer  . Smokeless tobacco: Not on file  . Alcohol Use: No     Current outpatient prescriptions:  .  b complex vitamins capsule, Take 1 capsule by mouth daily., Disp: , Rfl:  .  Calcium Carb-Cholecalciferol (CALCIUM 1000 + D PO), Take 1,000 mg by mouth daily., Disp: , Rfl:  .  docusate sodium (COLACE) 100 MG capsule, Take 100 mg by mouth 2 (two) times daily., Disp: , Rfl:  .  estradiol (ESTRACE) 1 MG tablet, Take 1 mg by mouth daily., Disp: , Rfl: 0 .  estrogens, conjugated, (PREMARIN) 0.45 MG tablet, Take 0.5 mg by mouth 2 (two) times a week. Take daily for 21 days then do not take for 7 days., Disp: , Rfl:  .  Ferrous Gluconate (IRON) 240 (27 FE) MG TABS, Take 27 mcg by mouth daily., Disp: , Rfl:  .  PREMARIN vaginal cream, USE 1/2 GRAM VAGINALLY TWICE A WEEK AS DIRECTED, Disp: , Rfl: 4 .  vitamin B-12 (CYANOCOBALAMIN) 500 MCG tablet, Take 500 mcg by mouth daily., Disp: , Rfl:   Past  Surgical History  Procedure Laterality Date  . Total vaginal hysterectomy  12/2013  . Bariatric surgery  09/2012  . Laparoscopic cholecystectomy  09/1996    Family History  Problem Relation Age of Onset  . Arthritis Mother   . Hyperlipidemia Mother   . Cancer Father   . Hyperlipidemia Father   . Vision loss Father   . Arthritis Maternal Grandmother   . Stroke Maternal Grandmother   . Cancer Paternal Grandmother   . Cancer Paternal Grandfather   . Stroke Paternal Grandfather     No Known Allergies   Review of Systems  CONSTITUTIONAL: No significant weight changes, fever, chills, weakness or fatigue.  HEENT:  - Eyes: No visual changes.  - Ears: No auditory changes. No pain.  - Nose: No sneezing, congestion, runny nose. - Throat: No sore throat. No changes in swallowing. SKIN: No rash or itching.  CARDIOVASCULAR: No chest pain, chest pressure or chest discomfort. No palpitations or edema.  RESPIRATORY: No shortness of breath, cough or sputum.  GASTROINTESTINAL: No anorexia, nausea, vomiting. No changes in bowel habits. No abdominal pain or blood.  GENITOURINARY: No dysuria. No frequency. No discharge.  NEUROLOGICAL: No headache, dizziness, syncope, paralysis, ataxia, numbness or tingling in the extremities. No memory changes. No change in bowel or bladder control.  MUSCULOSKELETAL: No joint pain. No muscle pain. HEMATOLOGIC: No anemia, bleeding or  bruising.  LYMPHATICS: No enlarged lymph nodes.  PSYCHIATRIC: No change in mood. No change in sleep pattern.  ENDOCRINOLOGIC: No reports of sweating, cold or heat intolerance. No polyuria or polydipsia.     Objective  BP 118/62 mmHg  Pulse 98  Temp(Src) 98.6 F (37 C) (Oral)  Resp 16  Wt 168 lb 9.6 oz (76.476 kg)  SpO2 98% Body mass index is 27.23 kg/(m^2).  Physical Exam  Constitutional: Patient appears well-developed and well-nourished. In no distress.  HEENT:  - Head: Normocephalic and atraumatic.  - Ears:  Bilateral TMs gray, no erythema or effusion - Nose: Nasal mucosa moist - Mouth/Throat: Oropharynx is clear and moist. No tonsillar hypertrophy or erythema. No post nasal drainage.  - Eyes: Conjunctivae clear, EOM movements normal. PERRLA. No scleral icterus.  Neck: Normal range of motion. Neck supple. No JVD present. No thyromegaly present.  Cardiovascular: Normal rate, regular rhythm and normal heart sounds.  No murmur heard.  Pulmonary/Chest: Effort normal and breath sounds normal. No respiratory distress. Musculoskeletal: Normal range of motion bilateral UE and LE, no joint effusions. Peripheral vascular: Bilateral LE no edema. Neurological: CN II-XII grossly intact with no focal deficits. Alert and oriented to person, place, and time. Coordination, balance, strength, speech and gait are normal.  Skin: Skin is warm and dry. No rash noted. No erythema.  Psychiatric: Patient has a normal mood and affect. Behavior is normal in office today. Judgment and thought content normal in office today.   Recent Results (from the past 2160 hour(s))  TSH     Status: None   Collection Time: 10/05/14  8:18 AM  Result Value Ref Range   TSH 3.320 0.450 - 4.500 uIU/mL  Lipid Profile     Status: None   Collection Time: 10/05/14  8:18 AM  Result Value Ref Range   Cholesterol, Total 176 100 - 199 mg/dL   Triglycerides 53 0 - 149 mg/dL   HDL 77 >40 mg/dL    Comment: According to ATP-III Guidelines, HDL-C >59 mg/dL is considered a negative risk factor for CHD.    VLDL Cholesterol Cal 11 5 - 40 mg/dL   LDL Calculated 88 0 - 99 mg/dL   Chol/HDL Ratio 2.3 0.0 - 4.4 ratio units    Comment:                                   T. Chol/HDL Ratio                                             Men  Women                               1/2 Avg.Risk  3.4    3.3                                   Avg.Risk  5.0    4.4                                2X Avg.Risk  9.6    7.1  3X Avg.Risk 23.4    11.0      Assessment & Plan 1. Iron deficiency anemia due to dietary causes Stable on oral replacement per review of lab work.  - EKG 12-Lead  2. Post-menopause on HRT (hormone replacement therapy) Lab work in the past 6 months reviewed along with EKG done today, no significant findings to contraindicate moving forward with proposed surgery. I did discussed HRT can increase risk of DVT/PE, recommended routine precautions such as compression devices per surgery protocol.   - EKG 12-Lead

## 2014-12-28 HISTORY — PX: ABDOMINOPLASTY: SUR9

## 2014-12-28 HISTORY — PX: OTHER SURGICAL HISTORY: SHX169

## 2015-06-27 HISTORY — PX: OTHER SURGICAL HISTORY: SHX169

## 2015-06-27 HISTORY — PX: BRACHIOPLASTY: SUR162

## 2015-06-27 HISTORY — PX: BREAST ENHANCEMENT SURGERY: SHX7

## 2015-09-12 ENCOUNTER — Encounter: Payer: 59 | Admitting: Obstetrics and Gynecology

## 2015-09-20 ENCOUNTER — Encounter: Payer: 59 | Admitting: Obstetrics and Gynecology

## 2015-09-27 HISTORY — PX: AUGMENTATION MAMMAPLASTY: SUR837

## 2015-10-08 ENCOUNTER — Other Ambulatory Visit: Payer: Self-pay | Admitting: Obstetrics and Gynecology

## 2015-10-17 ENCOUNTER — Encounter: Payer: 59 | Admitting: Obstetrics and Gynecology

## 2015-10-18 ENCOUNTER — Encounter: Payer: Self-pay | Admitting: Obstetrics and Gynecology

## 2015-10-18 ENCOUNTER — Ambulatory Visit (INDEPENDENT_AMBULATORY_CARE_PROVIDER_SITE_OTHER): Payer: 59 | Admitting: Obstetrics and Gynecology

## 2015-10-18 VITALS — BP 145/79 | HR 77 | Ht 66.0 in | Wt 177.8 lb

## 2015-10-18 DIAGNOSIS — Z9079 Acquired absence of other genital organ(s): Secondary | ICD-10-CM

## 2015-10-18 DIAGNOSIS — Z01419 Encounter for gynecological examination (general) (routine) without abnormal findings: Secondary | ICD-10-CM | POA: Diagnosis not present

## 2015-10-18 DIAGNOSIS — Z79890 Hormone replacement therapy (postmenopausal): Secondary | ICD-10-CM

## 2015-10-18 DIAGNOSIS — Z1211 Encounter for screening for malignant neoplasm of colon: Secondary | ICD-10-CM | POA: Diagnosis not present

## 2015-10-18 DIAGNOSIS — Z1239 Encounter for other screening for malignant neoplasm of breast: Secondary | ICD-10-CM | POA: Diagnosis not present

## 2015-10-18 DIAGNOSIS — E8940 Asymptomatic postprocedural ovarian failure: Secondary | ICD-10-CM | POA: Diagnosis not present

## 2015-10-18 DIAGNOSIS — Z90710 Acquired absence of both cervix and uterus: Secondary | ICD-10-CM | POA: Diagnosis not present

## 2015-10-18 DIAGNOSIS — K469 Unspecified abdominal hernia without obstruction or gangrene: Secondary | ICD-10-CM

## 2015-10-18 DIAGNOSIS — Z90722 Acquired absence of ovaries, bilateral: Secondary | ICD-10-CM | POA: Diagnosis not present

## 2015-10-18 MED ORDER — ESTRADIOL 1 MG PO TABS: 1.0000 mg | ORAL_TABLET | Freq: Every day | ORAL | Status: DC

## 2015-10-18 NOTE — Addendum Note (Signed)
Addended by: Sharon SellerEFRANCESCO, Dionne Rossa on: 10/18/2015 09:59 AM   Modules accepted: Kipp BroodSmartSet

## 2015-10-18 NOTE — Patient Instructions (Signed)
1. No Pap smear needed 2. Mammogram to be in 6 months 3. Stool guaiac cards given for colon cancer screening 4. Screening lab work ordered 5. Estradiol 1 mg daily refill 6. Premarin cream intravaginal weekly 7. Return in 1 year

## 2015-10-18 NOTE — Progress Notes (Signed)
Patient ID: Tracey Jensen, female   DOB: [birth year], 65 y.o.   MRN: [id number] ANNUAL PREVENTATIVE CARE GYBarney Jensen  ENCOUNTER NOTE  Subjective:       Tracey Jensen D Jensen is a 65 y.o. 312P2002 female here for a routine annual gynecologic exam.  Current complaints: 1.  Vaginal prolapse?????    Tracey Jensen is status post TVH BSO with anterior/posteriohaphy in 2015 She does note potential recurrence of prolapse. Bladder  Function is normal without leaking.She reports complete emptying when voiding. Bowel movements are regular every other day and she is not having to splint. She is not experiencing any significant pelvic pain or pressure. She questionably notes a bulge at the vaginal opening.   Gynecologic History No LMP recorded. Patient has had a hysterectomy. Contraception: status post hysterectomy Status post recent breast augmentation  Obstetric History OB History  Gravida Para Term Preterm AB SAB TAB Ectopic Multiple Living  2 2 2       2     # Outcome Date GA Lbr Len/2nd Weight Sex Delivery Anes PTL Lv  2 Term           1 Term               Past Medical History  Diagnosis Date  . Blood transfusion without reported diagnosis   . Sleep apnea     history of prior to surgery  . Iron deficiency anemia due to dietary causes 10/03/2014    Past Surgical History  Procedure Laterality Date  . Total vaginal hysterectomy  12/2013  . Bariatric surgery  09/2012  . Laparoscopic cholecystectomy  09/1996  . Lower body lift  12/2014  . Brachioplasty  06/2015  . Breast lift  06/2015  . Breast enhancement surgery  06/2015    Current Outpatient Prescriptions on File Prior to Visit  Medication Sig Dispense Refill  . b complex vitamins capsule Take 1 capsule by mouth daily.    Marland Kitchen. docusate sodium (COLACE) 100 MG capsule Take 100 mg by mouth 2 (two) times daily.    Marland Kitchen. estradiol (ESTRACE) 1 MG tablet TAKE 1 TABLET BY MOUTH EVERY DAY 30 tablet 2  . estrogens, conjugated, (PREMARIN) 0.45 MG tablet Take 0.5 mg by mouth 2 (two)  times a week. Take daily for 21 days then do not take for 7 days.    . vitamin B-12 (CYANOCOBALAMIN) 500 MCG tablet Take 500 mcg by mouth daily.    . Calcium Carb-Cholecalciferol (CALCIUM 1000 + D PO) Take 1,000 mg by mouth daily.     No current facility-administered medications on file prior to visit.    No Known Allergies  Social History   Social History  . Marital Status: Married    Spouse Name: N/A  . Number of Children: 2  . Years of Education: N/A   Occupational History  . retired [employer]   Social History Main Topics  . Smoking status: Former Games developermoker  . Smokeless tobacco: Not on file  . Alcohol Use: No  . Drug Use: No  . Sexual Activity:    Partners: Male    Birth Control/ Protection: Surgical   Other Topics Concern  . Not on file   Social History Narrative    Family History  Problem Relation Age of Onset  . Arthritis Mother   . Hyperlipidemia Mother   . Cancer Father   . Hyperlipidemia Father   . Vision loss Father   . Arthritis Maternal Grandmother   . Stroke Maternal Grandmother   .  Cancer Paternal Grandmother   . Cancer Paternal Grandfather   . Stroke Paternal Grandfather     The following portions of the patient's history were reviewed and updated as appropriate: allergies, current medications, past family history, past medical history, past social history, past surgical history and problem list.  Review of Systems ROS Review of Systems - General ROS: negative for - chills, fatigue, fever, hot flashes, night sweats, weight gain or weight loss Psychological ROS: negative for - anxiety, decreased libido, depression, mood swings, physical abuse or sexual abuse Ophthalmic ROS: negative for - blurry vision, eye pain or loss of vision ENT ROS: negative for - headaches, hearing change, visual changes or vocal changes Allergy and Immunology ROS: negative for - hives, itchy/watery eyes or seasonal allergies Hematological and Lymphatic ROS: negative for -  bleeding problems, bruising, swollen lymph nodes or weight loss Endocrine ROS: negative for - galactorrhea, hair pattern changes, hot flashes, malaise/lethargy, mood swings, palpitations, polydipsia/polyuria, skin changes, temperature intolerance or unexpected weight changes Breast ROS: negative for - new or changing breast lumps or nipple discharge Respiratory ROS: negative for - cough or shortness of breath Cardiovascular ROS: negative for - chest pain, irregular heartbeat, palpitations or shortness of breath Gastrointestinal ROS: no abdominal pain, change in bowel habits, or black or bloody stools Genito-Urinary ROS: no dysuria, trouble voiding, or hematuria Musculoskeletal ROS: negative for - joint pain or joint stiffness Neurological ROS: negative for - bowel and bladder control changes Dermatological ROS: negative for rash and skin lesion changes   Objective:   BP 145/79 mmHg  Pulse 77  Ht  (1.676 m)  Wt 177 lb 12.8 oz (80.65 kg)  BMI 28.71 kg/m2 CONSTITUTIONAL: Well-developed, well-nourished female in no acute distress.  PSYCHIATRIC: Normal mood and affect. Normal behavior. Normal judgment and thought content. NEUROLGIC: Alert and oriented to person, place, and time. Normal muscle tone coordination. No cranial nerve deficit noted. HENT:  Normocephalic, atraumatic, External right and left ear normal. Oropharynx is clear and moist EYES: Conjunctivae and EOM are normal. Pupils are equal, round, and reactive to light. No scleral icterus.  NECK: Normal range of motion, supple, no masses.  Normal thyroid.  SKIN: Skin is warm and dry. No rash noted. Not diaphoretic. No erythema. No pallor. CARDIOVASCULAR: Normal heart rate noted, regular rhythm, no murmur. RESPIRATORY: Clear to auscultation bilaterally. Effort and breath sounds normal, no problems with respiration noted. BREASTS: Symmetric in size.bilateral breast implants with inframammary scars healing at this time No masses, skin  changes, nipple drainage, or lymphadenopathy. ABDOMEN: Soft, normal bowel sounds, no distention noted.  No tenderness, rebound or guarding.  BLADDER: Normal PELVIC:  External Genitalia: Normal  BUS: Normal  Vagina: good estrogen effect;first degree cystocele with excellent support at the urethrovesical junction;no significant rectocele; excess vaginal mucosa arising from the apex possibly consistent with enterocele although no palpable bowel can be appreciated within the redundant tissue  Cervix: surgically absent  Uterus: surgically absent  Adnexa: Normal  RV: External Exam NormaI, No Rectal Masses and Normal Sphincter tone  MUSCULOSKELETAL: Normal range of motion. No tenderness.  No cyanosis, clubbing, or edema.  2+ distal pulses. LYMPHATIC: No Axillary, Supraclavicular, or Inguinal Adenopathy.    Assessment:   Annual gynecologic examination 65 y.o. Contraception: status post hysterectomy bmi Surgical menopause, asymptomatic on ERT Status post recent breast augmentation Redundant vaginal mucosa at the vaginal apex likely secondary to limited tissue removed at the time of vaginal repair and due to the patient's extreme weight loss prior to  surgery (with extreme tissue redundency); this does not appear to be enterocele  Plan:  Pap: not needed Mammogram: Ordered Stool Guaiac Testing:  will order colonoscopy Labs: lipid tsh  Routine preventative health maintenance measures emphasized: Exercise/Diet/Weight control, Tobacco Warnings and Alcohol/Substance use risks Refill estradiol 1 mg daily Refill Premarin cream intravaginal once a week Return to Clinic - 1 Year   Crystal CovingtonMiller, CMA  Herold HarmsMartin A Yuji Walth, MD   Note: This dictation was prepared with Dragon dictation along with smaller phrase technology. Any transcriptional errors that result from this process are unintentional.

## 2015-10-22 ENCOUNTER — Other Ambulatory Visit: Payer: 59

## 2015-10-23 ENCOUNTER — Telehealth: Payer: Self-pay | Admitting: Gastroenterology

## 2015-10-23 LAB — LIPID PANEL
CHOLESTEROL TOTAL: 177 mg/dL (ref 100–199)
Chol/HDL Ratio: 2.4 ratio units (ref 0.0–4.4)
HDL: 74 mg/dL (ref >39)
LDL CALC: 88 mg/dL (ref 0–99)
TRIGLYCERIDES: 73 mg/dL (ref 0–149)
VLDL CHOLESTEROL CAL: 15 mg/dL (ref 5–40)

## 2015-10-23 LAB — TSH: TSH: 2.25 u[IU]/mL (ref 0.450–4.500)

## 2015-10-23 NOTE — Telephone Encounter (Signed)
Patient is returning you call to set up appointment for a colonoscopy

## 2015-10-24 ENCOUNTER — Other Ambulatory Visit: Payer: Self-pay

## 2015-10-24 NOTE — Telephone Encounter (Signed)
Gastroenterology Pre-Procedure Review  Request Date: 11/08/15 Requesting Physician: Dr.  PATIENT REVIEW QUESTIONS: The patient responded to the following health history questions as indicated:    1. Are you having any GI issues? no 2. Do you have a personal history of Polyps? no 3. Do you have a family history of Colon Cancer or Polyps? Paternal grandmother 4. Diabetes Mellitus? no 5. Joint replacements in the past 12 months?no 6. Major health problems in the past 3 months?no 7. Any artificial heart valves, MVP, or defibrillator?no    MEDICATIONS & ALLERGIES:    Patient reports the following regarding taking any anticoagulation/antiplatelet therapy:   Plavix, Coumadin, Eliquis, Xarelto, Lovenox, Pradaxa, Brilinta, or Effient? no Aspirin? no  Patient confirms/reports the following medications:  Current Outpatient Prescriptions  Medication Sig Dispense Refill  . b complex vitamins capsule Take 1 capsule by mouth daily.    . Calcium Carb-Cholecalciferol (CALCIUM 1000 + D PO) Take 1,000 mg by mouth daily.    Marland Kitchen. docusate sodium (COLACE) 100 MG capsule Take 100 mg by mouth 2 (two) times daily.    Marland Kitchen. estradiol (ESTRACE) 1 MG tablet Take 1 tablet (1 mg total) by mouth daily. 90 tablet 4  . estrogens, conjugated, (PREMARIN) 0.45 MG tablet Take 0.5 mg by mouth 2 (two) times a week. Take daily for 21 days then do not take for 7 days.    . vitamin B-12 (CYANOCOBALAMIN) 500 MCG tablet Take 500 mcg by mouth daily.     No current facility-administered medications for this visit.    Patient confirms/reports the following allergies:  No Known Allergies  No orders of the defined types were placed in this encounter.    AUTHORIZATION INFORMATION Primary Insurance: 1D#: Group #:  Secondary Insurance: 1D#: Group #:  SCHEDULE INFORMATION: Date: 11/08/15 Time: Location: [facility]

## 2015-10-24 NOTE — Telephone Encounter (Signed)
LVM again for pt to return my call.  

## 2015-11-01 ENCOUNTER — Encounter: Payer: Self-pay | Admitting: *Deleted

## 2015-11-07 NOTE — Discharge Instructions (Signed)

## 2015-11-08 ENCOUNTER — Ambulatory Visit: Payer: 59 | Admitting: Anesthesiology

## 2015-11-08 ENCOUNTER — Encounter
Admission: RE | Disposition: A | Discharge status: Home / Self Care | Payer: Self-pay | Source: Ambulatory Visit | Attending: Gastroenterology

## 2015-11-08 ENCOUNTER — Ambulatory Visit
Admission: RE | Admit: 2015-11-08 | Discharge: 2015-11-08 | Disposition: A | Discharge status: Home / Self Care | Payer: 59 | Source: Ambulatory Visit | Attending: Gastroenterology | Admitting: Gastroenterology

## 2015-11-08 DIAGNOSIS — Z823 Family history of stroke: Secondary | ICD-10-CM | POA: Diagnosis not present

## 2015-11-08 DIAGNOSIS — Z8349 Family history of other endocrine, nutritional and metabolic diseases: Secondary | ICD-10-CM | POA: Insufficient documentation

## 2015-11-08 DIAGNOSIS — Z87891 Personal history of nicotine dependence: Secondary | ICD-10-CM | POA: Diagnosis not present

## 2015-11-08 DIAGNOSIS — Z9049 Acquired absence of other specified parts of digestive tract: Secondary | ICD-10-CM | POA: Insufficient documentation

## 2015-11-08 DIAGNOSIS — Z821 Family history of blindness and visual loss: Secondary | ICD-10-CM | POA: Insufficient documentation

## 2015-11-08 DIAGNOSIS — K641 Second degree hemorrhoids: Secondary | ICD-10-CM | POA: Diagnosis not present

## 2015-11-08 DIAGNOSIS — G4730 Sleep apnea, unspecified: Secondary | ICD-10-CM | POA: Insufficient documentation

## 2015-11-08 DIAGNOSIS — Z8261 Family history of arthritis: Secondary | ICD-10-CM | POA: Diagnosis not present

## 2015-11-08 DIAGNOSIS — Z79818 Long term (current) use of other agents affecting estrogen receptors and estrogen levels: Secondary | ICD-10-CM | POA: Insufficient documentation

## 2015-11-08 DIAGNOSIS — K5730 Diverticulosis of large intestine without perforation or abscess without bleeding: Secondary | ICD-10-CM | POA: Diagnosis not present

## 2015-11-08 DIAGNOSIS — Z9889 Other specified postprocedural states: Secondary | ICD-10-CM | POA: Diagnosis not present

## 2015-11-08 DIAGNOSIS — Z9884 Bariatric surgery status: Secondary | ICD-10-CM | POA: Insufficient documentation

## 2015-11-08 DIAGNOSIS — Z1211 Encounter for screening for malignant neoplasm of colon: Secondary | ICD-10-CM | POA: Insufficient documentation

## 2015-11-08 DIAGNOSIS — Z79899 Other long term (current) drug therapy: Secondary | ICD-10-CM | POA: Diagnosis not present

## 2015-11-08 DIAGNOSIS — Z411 Encounter for cosmetic surgery: Secondary | ICD-10-CM | POA: Diagnosis not present

## 2015-11-08 HISTORY — PX: COLONOSCOPY WITH PROPOFOL: SHX5780

## 2015-11-08 SURGERY — COLONOSCOPY WITH PROPOFOL
Anesthesia: Monitor Anesthesia Care | Wound class: Contaminated

## 2015-11-08 MED ORDER — SIMETHICONE 40 MG/0.6ML PO SUSP
ORAL | Status: DC | PRN
  Administered 2015-11-08: 10:00:00

## 2015-11-08 MED ORDER — LACTATED RINGERS IV SOLN
INTRAVENOUS | Status: DC
  Administered 2015-11-08: 09:00:00 via INTRAVENOUS

## 2015-11-08 MED ORDER — ONDANSETRON HCL 4 MG/2ML IJ SOLN: 4.0000 mg | Freq: Once | INTRAMUSCULAR | Status: DC | PRN

## 2015-11-08 MED ORDER — PROPOFOL 10 MG/ML IV BOLUS
INTRAVENOUS | Status: DC | PRN
  Administered 2015-11-08: 10 mg via INTRAVENOUS
  Administered 2015-11-08 (×3): 20 mg via INTRAVENOUS
  Administered 2015-11-08: 70 mg via INTRAVENOUS
  Administered 2015-11-08: 10 mg via INTRAVENOUS
  Administered 2015-11-08: 20 mg via INTRAVENOUS
  Administered 2015-11-08: 10 mg via INTRAVENOUS
  Administered 2015-11-08 (×4): 20 mg via INTRAVENOUS

## 2015-11-08 MED ORDER — LIDOCAINE HCL (CARDIAC) 20 MG/ML IV SOLN
INTRAVENOUS | Status: DC | PRN
  Administered 2015-11-08: 50 mg via INTRAVENOUS

## 2015-11-08 MED ORDER — ACETAMINOPHEN 160 MG/5ML PO SOLN: 325.0000 mg | ORAL | Status: DC | PRN

## 2015-11-08 MED ORDER — ACETAMINOPHEN 325 MG PO TABS: 325.0000 mg | ORAL_TABLET | ORAL | Status: DC | PRN

## 2015-11-08 SURGICAL SUPPLY — 23 items
CANISTER SUCT 1200ML W/VALVE (MISCELLANEOUS) ×3 IMPLANT
CLIP HMST 235XBRD CATH ROT (MISCELLANEOUS) IMPLANT
CLIP RESOLUTION 360 11X235 (MISCELLANEOUS)
FCP ESCP3.2XJMB 240X2.8X (MISCELLANEOUS)
FORCEPS BIOP RAD 4 LRG CAP 4 (CUTTING FORCEPS) IMPLANT
FORCEPS BIOP RJ4 240 W/NDL (MISCELLANEOUS)
FORCEPS ESCP3.2XJMB 240X2.8X (MISCELLANEOUS) IMPLANT
GOWN CVR UNV OPN BCK APRN NK (MISCELLANEOUS) ×2 IMPLANT
GOWN ISOL THUMB LOOP REG UNIV (MISCELLANEOUS) ×4
INJECTOR VARIJECT VIN23 (MISCELLANEOUS) IMPLANT
KIT DEFENDO VALVE AND CONN (KITS) IMPLANT
KIT ENDO PROCEDURE OLY (KITS) ×3 IMPLANT
MARKER SPOT ENDO TATTOO 5ML (MISCELLANEOUS) IMPLANT
PAD GROUND ADULT SPLIT (MISCELLANEOUS) IMPLANT
PROBE APC STR FIRE (PROBE) IMPLANT
RETRIEVER NET ROTH 2.5X230 LF (MISCELLANEOUS) ×3 IMPLANT
SNARE SHORT THROW 13M SML OVAL (MISCELLANEOUS) IMPLANT
SNARE SHORT THROW 30M LRG OVAL (MISCELLANEOUS) IMPLANT
SNARE SNG USE RND 15MM (INSTRUMENTS) IMPLANT
SPOT EX ENDOSCOPIC TATTOO (MISCELLANEOUS)
TRAP ETRAP POLY (MISCELLANEOUS) IMPLANT
VARIJECT INJECTOR VIN23 (MISCELLANEOUS)
WATER STERILE IRR 250ML POUR (IV SOLUTION) ×3 IMPLANT

## 2015-11-08 NOTE — H&P (Signed)
Tracey Minium, MD [facility] [address], [address] [place], [place] [address] Phone: [phone] Fax : [phone]  Primary Care Physician:  Baruch Gouty, MD Primary Gastroenterologist:  Dr. Servando Snare  Pre-Procedure History & Physical: HPI:  Tracey Jensen is a 65 y.o. female is here for a screening colonoscopy.   Past Medical History  Diagnosis Date  . Blood transfusion without reported diagnosis   . Sleep apnea     history of prior to surgery  . Iron deficiency anemia due to dietary causes 10/03/2014    Past Surgical History  Procedure Laterality Date  . Total vaginal hysterectomy  12/2013  . Bariatric surgery  09/2012  . Laparoscopic cholecystectomy  09/1996  . Lower body lift  12/2014  . Brachioplasty  06/2015  . Breast lift  06/2015  . Breast enhancement surgery  06/2015    Prior to Admission medications   Medication Sig Start Date End Date Taking? Authorizing Provider  b complex vitamins capsule Take 1 capsule by mouth daily.   Yes Historical Provider, MD  Calcium Carb-Cholecalciferol (CALCIUM 1000 + D PO) Take 1,000 mg by mouth daily.   Yes Historical Provider, MD  docusate sodium (COLACE) 100 MG capsule Take 100 mg by mouth 2 (two) times daily.   Yes Historical Provider, MD  estradiol (ESTRACE) 1 MG tablet Take 1 tablet (1 mg total) by mouth daily. 10/18/15  Yes Prentice Docker Defrancesco, MD  estrogens, conjugated, (PREMARIN) 0.45 MG tablet Take 0.5 mg by mouth 2 (two) times a week. Take daily for 21 days then do not take for 7 days.   Yes Historical Provider, MD  Multiple Vitamin (MULTIVITAMIN) capsule Take 1 capsule by mouth daily.   Yes Historical Provider, MD  vitamin B-12 (CYANOCOBALAMIN) 500 MCG tablet Take 500 mcg by mouth daily.   Yes Historical Provider, MD    Allergies as of 10/24/2015  . (No Known Allergies)    Family History  Problem Relation Age of Onset  . Arthritis Mother   . Hyperlipidemia Mother   . Cancer Father   . Hyperlipidemia Father   . Vision loss Father     . Arthritis Maternal Grandmother   . Stroke Maternal Grandmother   . Cancer Paternal Grandmother   . Cancer Paternal Grandfather   . Stroke Paternal Grandfather     Social History   Social History  . Marital Status: Married    Spouse Name: N/A  . Number of Children: 2  . Years of Education: N/A   Occupational History  . retired Sports coach   Social History Main Topics  . Smoking status: Former Games developer  . Smokeless tobacco: Not on file     Comment: quit 35+ yrs ago  . Alcohol Use: No  . Drug Use: No  . Sexual Activity:    Partners: Male    Birth Control/ Protection: Surgical   Other Topics Concern  . Not on file   Social History Narrative    Review of Systems: See HPI, otherwise negative ROS  Physical Exam: Ht  (1.676 m)  Wt 178 lb (80.74 kg)  BMI 28.74 kg/m2 General:   Alert,  pleasant and cooperative in NAD Head:  Normocephalic and atraumatic. Neck:  Supple; no masses or thyromegaly. Lungs:  Clear throughout to auscultation.    Heart:  Regular rate and rhythm. Abdomen:  Soft, nontender and nondistended. Normal bowel sounds, without guarding, and without rebound.   Neurologic:  Alert and  oriented x4;  grossly normal neurologically.  Impression/Plan: Tracey Amen  D Newman Jensen is now here to undergo a screening colonoscopy.  Risks, benefits, and alternatives regarding colonoscopy have been reviewed with the patient.  Questions have been answered.  All parties agreeable.

## 2015-11-08 NOTE — Anesthesia Postprocedure Evaluation (Signed)
Anesthesia Post Note  Patient: Tracey Jensen  Procedure(s) Performed: Procedure(s) (LRB): COLONOSCOPY WITH PROPOFOL (N/A)  Patient location during evaluation: PACU Anesthesia Type: MAC Level of consciousness: awake and alert and oriented Pain management: pain level controlled Vital Signs Assessment: post-procedure vital signs reviewed and stable Respiratory status: spontaneous breathing and nonlabored ventilation Cardiovascular status: stable Postop Assessment: no signs of nausea or vomiting and adequate PO intake Anesthetic complications: no    Harolyn RutherfordJoshua Denzal Meir

## 2015-11-08 NOTE — Op Note (Signed)
[facility] Patient Name: Tracey Jensen Procedure Date: 11/08/2015 10:12 AM MRN: [id number] Account #: [id number] Date of Birth: 1950-09-02 Admit Type: Outpatient Age: 65 Room: [facility] OR ROOM 01 Gender: Female Note Status: Finalized Procedure:            Colonoscopy Indications:          Screening for colorectal malignant neoplasm Providers:            Midge Minium MD, MD Referring MD:         Edwena Felty (Referring MD) Medicines:            Propofol per Anesthesia Complications:        No immediate complications. Procedure:            Pre-Anesthesia Assessment:                       - Prior to the procedure, a History and Physical was                        performed, and patient medications and allergies were                        reviewed. The patient's tolerance of previous                        anesthesia was also reviewed. The risks and benefits of                        the procedure and the sedation options and risks were                        discussed with the patient. All questions were                        answered, and informed consent was obtained. Prior                        Anticoagulants: The patient has taken no previous                        anticoagulant or antiplatelet agents. ASA Grade                        Assessment: II - A patient with mild systemic disease.                        After reviewing the risks and benefits, the patient was                        deemed in satisfactory condition to undergo the                        procedure.                       After obtaining informed consent, the colonoscope was                        passed under direct vision. Throughout the procedure,  the patient's blood pressure, pulse, and oxygen                        saturations were monitored continuously. The Olympus                        CF-HQ190L Colonoscope (S#. [phone]) was introduced               through the anus and advanced to the the cecum,                        identified by appendiceal orifice and ileocecal valve.                        The colonoscopy was performed without difficulty. The                        patient tolerated the procedure well. The quality of                        the bowel preparation was poor. Findings:      The perianal and digital rectal examinations were normal.      Multiple small-mouthed diverticula were found in the sigmoid colon.      Non-bleeding internal hemorrhoids were found during retroflexion. The       hemorrhoids were Grade II (internal hemorrhoids that prolapse but reduce       spontaneously).      A large amount of liquid stool was found in the entire colon. Impression:           - Preparation of the colon was poor.                       - Diverticulosis in the sigmoid colon.                       - Non-bleeding internal hemorrhoids.                       - Stool in the entire examined colon.                       - No specimens collected. Recommendation:       - Repeat colonoscopy in 10 years for screening unless                        any change in family history or lower GI problems. Procedure Code(s):    --- Professional ---                       [phone], Colonoscopy, flexible; diagnostic, including                        collection of specimen(s) by brushing or washing, when                        performed (separate procedure) Diagnosis Code(s):    --- Professional ---                       Z12.11, Encounter for screening for malignant neoplasm  of colon CPT copyright 2016 American Medical Association. All rights reserved. The codes documented in this report are preliminary and upon coder review may  be revised to meet current compliance requirements. Midge Miniumarren Aowyn Rozeboom MD, MD 11/08/2015 10:42:09 AM This report has been signed electronically. Number of Addenda: 0 Note Initiated On: 11/08/2015 10:12  AM Scope Withdrawal Time: 0 hours 7 minutes 56 seconds  Total Procedure Duration: 0 hours 17 minutes 30 seconds       [facility]

## 2015-11-08 NOTE — Anesthesia Procedure Notes (Signed)
Procedure Name: MAC Performed by: Kahmari Koller Pre-anesthesia Checklist: Patient identified, Emergency Drugs available, Suction available, Timeout performed and Patient being monitored Patient Re-evaluated:Patient Re-evaluated prior to inductionOxygen Delivery Method: Nasal cannula Placement Confirmation: positive ETCO2       

## 2015-11-08 NOTE — Anesthesia Preprocedure Evaluation (Signed)
Anesthesia Evaluation  Patient identified by MRN, date of birth, ID band Patient awake    Reviewed: Allergy & Precautions, NPO status , Patient's Chart, lab work & pertinent test results  Airway Mallampati: II  TM Distance: >3 FB Neck ROM: Full    Dental no notable dental hx.    Pulmonary sleep apnea , former smoker,    Pulmonary exam normal        Cardiovascular negative cardio ROS Normal cardiovascular exam     Neuro/Psych negative psych ROS   GI/Hepatic negative GI ROS, Neg liver ROS,   Endo/Other  negative endocrine ROS  Renal/GU negative Renal ROS     Musculoskeletal negative musculoskeletal ROS (+)   Abdominal   Peds  Hematology  (+) anemia ,   Anesthesia Other Findings   Reproductive/Obstetrics                             Anesthesia Physical Anesthesia Plan  ASA: II  Anesthesia Plan: MAC   Post-op Pain Management:    Induction: Intravenous  Airway Management Planned:   Additional Equipment:   Intra-op Plan:   Post-operative Plan:   Informed Consent: I have reviewed the patients History and Physical, chart, labs and discussed the procedure including the risks, benefits and alternatives for the proposed anesthesia with the patient or authorized representative who has indicated his/her understanding and acceptance.     Plan Discussed with: CRNA  Anesthesia Plan Comments:         Anesthesia Quick Evaluation

## 2015-11-08 NOTE — Transfer of Care (Signed)
Immediate Anesthesia Transfer of Care Note  Patient: Tracey Jensen  Procedure(s) Performed: Procedure(s): COLONOSCOPY WITH PROPOFOL (N/A)  Patient Location: PACU  Anesthesia Type: MAC  Level of Consciousness: awake, alert  and patient cooperative  Airway and Oxygen Therapy: Patient Spontanous Breathing and Patient connected to supplemental oxygen  Post-op Assessment: Post-op Vital signs reviewed, Patient's Cardiovascular Status Stable, Respiratory Function Stable, Patent Airway and No signs of Nausea or vomiting  Post-op Vital Signs: Reviewed and stable  Complications: No apparent anesthesia complications

## 2015-11-09 ENCOUNTER — Encounter: Payer: Self-pay | Admitting: Gastroenterology

## 2016-04-15 ENCOUNTER — Other Ambulatory Visit: Payer: Self-pay | Admitting: Obstetrics and Gynecology

## 2016-04-15 ENCOUNTER — Ambulatory Visit
Admission: RE | Admit: 2016-04-15 | Discharge: 2016-04-15 | Disposition: A | Discharge status: Home / Self Care | Payer: 59 | Source: Ambulatory Visit | Attending: Obstetrics and Gynecology | Admitting: Obstetrics and Gynecology

## 2016-04-15 DIAGNOSIS — Z1239 Encounter for other screening for malignant neoplasm of breast: Secondary | ICD-10-CM

## 2016-04-15 DIAGNOSIS — Z1231 Encounter for screening mammogram for malignant neoplasm of breast: Secondary | ICD-10-CM | POA: Diagnosis not present

## 2016-04-15 DIAGNOSIS — R928 Other abnormal and inconclusive findings on diagnostic imaging of breast: Secondary | ICD-10-CM | POA: Insufficient documentation

## 2016-04-16 ENCOUNTER — Other Ambulatory Visit: Payer: Self-pay | Admitting: Obstetrics and Gynecology

## 2016-04-16 DIAGNOSIS — N6310 Unspecified lump in the right breast, unspecified quadrant: Secondary | ICD-10-CM

## 2016-04-16 DIAGNOSIS — R928 Other abnormal and inconclusive findings on diagnostic imaging of breast: Secondary | ICD-10-CM

## 2016-04-28 DIAGNOSIS — Z85850 Personal history of malignant neoplasm of thyroid: Secondary | ICD-10-CM

## 2016-04-28 HISTORY — DX: Personal history of malignant neoplasm of thyroid: Z85.850

## 2016-05-01 ENCOUNTER — Other Ambulatory Visit: Payer: Self-pay

## 2016-05-01 ENCOUNTER — Ambulatory Visit
Admission: RE | Admit: 2016-05-01 | Discharge: 2016-05-01 | Disposition: A | Discharge status: Home / Self Care | Payer: 59 | Source: Ambulatory Visit | Attending: Obstetrics and Gynecology | Admitting: Obstetrics and Gynecology

## 2016-05-01 ENCOUNTER — Other Ambulatory Visit: Payer: Self-pay | Admitting: Obstetrics and Gynecology

## 2016-05-01 DIAGNOSIS — N6310 Unspecified lump in the right breast, unspecified quadrant: Secondary | ICD-10-CM

## 2016-05-01 DIAGNOSIS — R928 Other abnormal and inconclusive findings on diagnostic imaging of breast: Secondary | ICD-10-CM | POA: Diagnosis present

## 2016-05-05 ENCOUNTER — Ambulatory Visit: Payer: 59

## 2016-09-15 ENCOUNTER — Other Ambulatory Visit: Payer: Self-pay

## 2016-09-15 ENCOUNTER — Encounter: Payer: Self-pay | Admitting: Obstetrics and Gynecology

## 2016-09-15 DIAGNOSIS — N6310 Unspecified lump in the right breast, unspecified quadrant: Secondary | ICD-10-CM

## 2016-10-20 NOTE — Progress Notes (Signed)
Patient ID: Tracey Jensen, female   DOB: 04-Aug-1950, 66 y.o.   MRN: [id number] ANNUAL PREVENTATIVE CARE GYN  ENCOUNTER NOTE  Subjective:       Tracey Jensen is a 66 y.o. G69P2002 female here for a routine annual gynecologic exam.  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  Today she presents to the office with no new complaints or issues.  She has mild vasomotor symptoms that are well controlled on ERT (estradiol 1mg  daily).  She denies and urinary symptoms, no changes in bowel or bladder function, no N/V/D or constipation.  Denies dyspareunia or vaginal dryness.       Gynecologic History No LMP recorded. Patient has had a hysterectomy. Contraception: status post hysterectomy Status post breast augmentation  Obstetric History OB History  Gravida Para Term Preterm AB Living  2 2 2     2   SAB TAB Ectopic Multiple Live Births          2    # Outcome Date GA Lbr Len/2nd Weight Sex Delivery Anes PTL Lv  2 Term 1977   7 lb 14.4 oz (3.583 kg) F    LIV  1 Term 1972   7 lb 11.2 oz (3.493 kg) M Vag-Spont   LIV      Past Medical History:  Diagnosis Date  . Blood transfusion without reported diagnosis   . Iron deficiency anemia due to dietary causes 10/03/2014  . Sleep apnea    history of prior to surgery    Past Surgical History:  Procedure Laterality Date  . AUGMENTATION MAMMAPLASTY Bilateral 09/2015   mastopexy and silicone implants  . BARIATRIC SURGERY  09/2012  . BELPHAROPTOSIS REPAIR    . BRACHIOPLASTY  06/2015  . BREAST ENHANCEMENT SURGERY  06/2015  . breast lift  06/2015  . COLONOSCOPY WITH PROPOFOL N/A 11/08/2015   Procedure: COLONOSCOPY WITH PROPOFOL;  Surgeon: Midge Minium, MD;  Location: [facility];  Service: Endoscopy;  Laterality: N/A;  . LAPAROSCOPIC CHOLECYSTECTOMY  09/1996  . lower body lift  12/2014  . TOTAL VAGINAL HYSTERECTOMY  12/2013    Current Outpatient Prescriptions on  File Prior to Visit  Medication Sig Dispense Refill  . b complex vitamins capsule Take 1 capsule by mouth daily.    . Calcium Carb-Cholecalciferol (CALCIUM 1000 + D PO) Take 1,000 mg by mouth daily.    . Multiple Vitamin (MULTIVITAMIN) capsule Take 1 capsule by mouth daily.    . vitamin B-12 (CYANOCOBALAMIN) 500 MCG tablet Take 500 mcg by mouth daily.     No current facility-administered medications on file prior to visit.     No Known Allergies  Social History   Social History  . Marital status: Married    Spouse name: N/A  . Number of children: 2  . Years of education: N/A   Occupational History  . retired Sports coach   Social History Main Topics  . Smoking status: Former Games developer  . Smokeless tobacco: Never Used     Comment: quit 35+ yrs ago  . Alcohol use No  . Drug use: No  . Sexual activity: Yes    Partners: Male    Birth control/ protection: Surgical   Other Topics Concern  . Not on file   Social History Narrative  . No narrative on file    Family History  Problem Relation Age of Onset  . Arthritis Mother   . Hyperlipidemia Mother   .  Cancer Father   . Hyperlipidemia Father   . Vision loss Father   . Cancer Paternal Grandmother   . Cancer Paternal Grandfather   . Stroke Paternal Grandfather   . Arthritis Maternal Grandmother   . Stroke Maternal Grandmother   . Lung cancer Sister   . Breast cancer Neg Hx   . Diabetes Neg Hx     The following portions of the patient's history were reviewed and updated as appropriate: allergies, current medications, past family history, past medical history, past social history, past surgical history and problem list.  Review of Systems ROS  See HPI for pertinent ROS   Objective:   BP 121/68   Pulse 84   Ht 5\' 6"  (1.676 m)   Wt 177 lb 8 oz (80.5 kg)   BMI 28.65 kg/m  CONSTITUTIONAL: Well-developed, well-nourished female in no acute distress.  PSYCHIATRIC: Normal mood and affect. Normal behavior. Normal judgment and  thought content. NEUROLGIC: Alert and oriented to person, place, and time. Normal muscle tone coordination. No cranial nerve deficit noted. HENT:  Normocephalic, atraumatic, External right and left ear normal. Oropharynx is clear and moist EYES: Conjunctivae and EOM are normal. Pupils are equal, round, and reactive to light. No scleral icterus.  NECK: Normal range of motion, supple, no masses.  Normal thyroid.  SKIN: Skin is warm and dry. No rash noted. Not diaphoretic. No erythema. No pallor. CARDIOVASCULAR: Normal heart rate noted, regular rhythm, no murmur. RESPIRATORY: Clear to auscultation bilaterally. Effort and breath sounds normal, no problems with respiration noted. BREASTS: Symmetric in size.bilateral breast implants with inframammary scars well healed. No skin changes, nipple drainage, or lymphadenopathy.  2 cm x 3 cm mobile, non tender nodule 6 o'clock R breast- previously biopsied diagnosed as fat necrosis. ABDOMEN: Soft, normal bowel sounds, no distention noted.  No tenderness, rebound or guarding.  BLADDER: Normal PELVIC:  External Genitalia: Normal  BUS: Normal  Vagina: good estrogen effect;first degree cystocele with excellent support at the urethrovesical junction;no significant rectocele; excess vaginal mucosa arising from the apex possibly consistent with enterocele although no palpable bowel can be appreciated within the redundant tissue  Cervix: surgically absent  Uterus: surgically absent  Adnexa: Normal  RV: External Exam NormaI, No Rectal Masses and Normal Sphincter tone  MUSCULOSKELETAL: Normal range of motion. No tenderness.  No cyanosis, clubbing, or edema.  2+ distal pulses. LYMPHATIC: No Axillary, Supraclavicular, or Inguinal Adenopathy.    Assessment:   Annual gynecologic examination 66 y.o. 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 Status post recent breast augmentation Redundant vaginal mucosa at the vaginal  apex likely secondary to limited tissue removed at the time of vaginal repair and due to the patient's extreme weight loss prior to surgery (with extreme tissue redundency); this does not appear to be enterocele  Plan:  Pap: not needed Mammogram: 11/05/2016 bilateral dx and u/s on rt - rt breast mass Stool Guaiac Testing:  utd Labs: screening labs ordered- lipids, A1C, TSH Routine preventative health maintenance measures emphasized: Exercise/Diet/Weight control, Tobacco Warnings and Alcohol/Substance use risks Refill estradiol 1 mg daily Return to Clinic - 1 Year   Flint Melter, Student-PA  Herold Harms, MD   I have seen, interviewed, and examined the patient in conjunction with the [facility] student and affirm the diagnosis and management plan. Tennile Styles A. Kerriann Kamphuis, MD, FACOG    Note: This dictation was prepared with Dragon dictation along with smaller phrase technology. Any transcriptional errors that result from  this process are unintentional.

## 2016-10-21 ENCOUNTER — Encounter: Payer: Self-pay | Admitting: Obstetrics and Gynecology

## 2016-10-21 ENCOUNTER — Ambulatory Visit (INDEPENDENT_AMBULATORY_CARE_PROVIDER_SITE_OTHER): Payer: Medicare Other | Admitting: Obstetrics and Gynecology

## 2016-10-21 VITALS — BP 121/68 | HR 84 | Ht 66.0 in | Wt 177.5 lb

## 2016-10-21 DIAGNOSIS — K469 Unspecified abdominal hernia without obstruction or gangrene: Secondary | ICD-10-CM

## 2016-10-21 DIAGNOSIS — Z90710 Acquired absence of both cervix and uterus: Secondary | ICD-10-CM

## 2016-10-21 DIAGNOSIS — Z1231 Encounter for screening mammogram for malignant neoplasm of breast: Secondary | ICD-10-CM

## 2016-10-21 DIAGNOSIS — Z1211 Encounter for screening for malignant neoplasm of colon: Secondary | ICD-10-CM | POA: Diagnosis not present

## 2016-10-21 DIAGNOSIS — N6310 Unspecified lump in the right breast, unspecified quadrant: Secondary | ICD-10-CM

## 2016-10-21 DIAGNOSIS — Z9079 Acquired absence of other genital organ(s): Secondary | ICD-10-CM

## 2016-10-21 DIAGNOSIS — E8940 Asymptomatic postprocedural ovarian failure: Secondary | ICD-10-CM | POA: Diagnosis not present

## 2016-10-21 DIAGNOSIS — Z8262 Family history of osteoporosis: Secondary | ICD-10-CM

## 2016-10-21 DIAGNOSIS — Z79890 Hormone replacement therapy (postmenopausal): Secondary | ICD-10-CM

## 2016-10-21 DIAGNOSIS — Z01419 Encounter for gynecological examination (general) (routine) without abnormal findings: Secondary | ICD-10-CM | POA: Diagnosis not present

## 2016-10-21 DIAGNOSIS — Z1239 Encounter for other screening for malignant neoplasm of breast: Secondary | ICD-10-CM

## 2016-10-21 DIAGNOSIS — Z90722 Acquired absence of ovaries, bilateral: Secondary | ICD-10-CM

## 2016-10-21 MED ORDER — ESTRADIOL 1 MG PO TABS: 1.0000 mg | ORAL_TABLET | Freq: Every day | ORAL | 4 refills | Status: DC

## 2016-10-21 NOTE — Patient Instructions (Signed)
1. No Pap smear needed 2. Mammogram ordered 3. Stool guaiac cards are deferred due to colonoscopy in the past year 4. DEXA scan for family history of osteoporosis is ordered 5. Continue with healthy eating and exercise 6. Continue with calcium and vitamin D supplementation daily 7. Estradiol 1 mg a day is refilled for a year 8. Return in 1 year   Health Maintenance for Postmenopausal Women Menopause is a normal process in which your reproductive ability comes to an end. This process happens gradually over a span of months to years, usually between the ages of 82 and 19. Menopause is complete when you have missed 12 consecutive menstrual periods. It is important to talk with your health care provider about some of the most common conditions that affect postmenopausal women, such as heart disease, cancer, and bone loss (osteoporosis). Adopting a healthy lifestyle and getting preventive care can help to promote your health and wellness. Those actions can also lower your chances of developing some of these common conditions. What should I know about menopause? During menopause, you may experience a number of symptoms, such as:  Moderate-to-severe hot flashes.  Night sweats.  Decrease in sex drive.  Mood swings.  Headaches.  Tiredness.  Irritability.  Memory problems.  Insomnia.  Choosing to treat or not to treat menopausal changes is an individual decision that you make with your health care provider. What should I know about hormone replacement therapy and supplements? Hormone therapy products are effective for treating symptoms that are associated with menopause, such as hot flashes and night sweats. Hormone replacement carries certain risks, especially as you become older. If you are thinking about using estrogen or estrogen with progestin treatments, discuss the benefits and risks with your health care provider. What should I know about heart disease and stroke? Heart disease,  heart attack, and stroke become more likely as you age. This may be due, in part, to the hormonal changes that your body experiences during menopause. These can affect how your body processes dietary fats, triglycerides, and cholesterol. Heart attack and stroke are both medical emergencies. There are many things that you can do to help prevent heart disease and stroke:  Have your blood pressure checked at least every 1-2 years. High blood pressure causes heart disease and increases the risk of stroke.  If you are 49-19 years old, ask your health care provider if you should take aspirin to prevent a heart attack or a stroke.  Do not use any tobacco products, including cigarettes, chewing tobacco, or electronic cigarettes. If you need help quitting, ask your health care provider.  It is important to eat a healthy diet and maintain a healthy weight. ? Be sure to include plenty of vegetables, fruits, low-fat dairy products, and lean protein. ? Avoid eating foods that are high in solid fats, added sugars, or salt (sodium).  Get regular exercise. This is one of the most important things that you can do for your health. ? Try to exercise for at least 150 minutes each week. The type of exercise that you do should increase your heart rate and make you sweat. This is known as moderate-intensity exercise. ? Try to do strengthening exercises at least twice each week. Do these in addition to the moderate-intensity exercise.  Know your numbers.Ask your health care provider to check your cholesterol and your blood glucose. Continue to have your blood tested as directed by your health care provider.  What should I know about cancer screening? There are  several types of cancer. Take the following steps to reduce your risk and to catch any cancer development as early as possible. Breast Cancer  Practice breast self-awareness. ? This means understanding how your breasts normally appear and feel. ? It also  means doing regular breast self-exams. Let your health care provider know about any changes, no matter how small.  If you are 49 or older, have a clinician do a breast exam (clinical breast exam or CBE) every year. Depending on your age, family history, and medical history, it may be recommended that you also have a yearly breast X-ray (mammogram).  If you have a family history of breast cancer, talk with your health care provider about genetic screening.  If you are at high risk for breast cancer, talk with your health care provider about having an MRI and a mammogram every year.  Breast cancer (BRCA) gene test is recommended for women who have family members with BRCA-related cancers. Results of the assessment will determine the need for genetic counseling and BRCA1 and for BRCA2 testing. BRCA-related cancers include these types: ? Breast. This occurs in males or females. ? Ovarian. ? Tubal. This may also be called fallopian tube cancer. ? Cancer of the abdominal or pelvic lining (peritoneal cancer). ? Prostate. ? Pancreatic.  Cervical, Uterine, and Ovarian Cancer Your health care provider may recommend that you be screened regularly for cancer of the pelvic organs. These include your ovaries, uterus, and vagina. This screening involves a pelvic exam, which includes checking for microscopic changes to the surface of your cervix (Pap test).  For women ages 21-65, health care providers may recommend a pelvic exam and a Pap test every three years. For women ages 21-65, they may recommend the Pap test and pelvic exam, combined with testing for human papilloma virus (HPV), every five years. Some types of HPV increase your risk of cervical cancer. Testing for HPV may also be done on women of any age who have unclear Pap test results.  Other health care providers may not recommend any screening for nonpregnant women who are considered low risk for pelvic cancer and have no symptoms. Ask your health  care provider if a screening pelvic exam is right for you.  If you have had past treatment for cervical cancer or a condition that could lead to cancer, you need Pap tests and screening for cancer for at least 20 years after your treatment. If Pap tests have been discontinued for you, your risk factors (such as having a new sexual partner) need to be reassessed to determine if you should start having screenings again. Some women have medical problems that increase the chance of getting cervical cancer. In these cases, your health care provider may recommend that you have screening and Pap tests more often.  If you have a family history of uterine cancer or ovarian cancer, talk with your health care provider about genetic screening.  If you have vaginal bleeding after reaching menopause, tell your health care provider.  There are currently no reliable tests available to screen for ovarian cancer.  Lung Cancer Lung cancer screening is recommended for adults 36-3 years old who are at high risk for lung cancer because of a history of smoking. A yearly low-dose CT scan of the lungs is recommended if you:  Currently smoke.  Have a history of at least 30 pack-years of smoking and you currently smoke or have quit within the past 15 years. A pack-year is smoking an average of  one pack of cigarettes per day for one year.  Yearly screening should:  Continue until it has been 15 years since you quit.  Stop if you develop a health problem that would prevent you from having lung cancer treatment.  Colorectal Cancer  This type of cancer can be detected and can often be prevented.  Routine colorectal cancer screening usually begins at age 42 and continues through age 70.  If you have risk factors for colon cancer, your health care provider may recommend that you be screened at an earlier age.  If you have a family history of colorectal cancer, talk with your health care provider about genetic  screening.  Your health care provider may also recommend using home test kits to check for hidden blood in your stool.  A small camera at the end of a tube can be used to examine your colon directly (sigmoidoscopy or colonoscopy). This is done to check for the earliest forms of colorectal cancer.  Direct examination of the colon should be repeated every 5-10 years until age 60. However, if early forms of precancerous polyps or small growths are found or if you have a family history or genetic risk for colorectal cancer, you may need to be screened more often.  Skin Cancer  Check your skin from head to toe regularly.  Monitor any moles. Be sure to tell your health care provider: ? About any new moles or changes in moles, especially if there is a change in a mole's shape or color. ? If you have a mole that is larger than the size of a pencil eraser.  If any of your family members has a history of skin cancer, especially at a young age, talk with your health care provider about genetic screening.  Always use sunscreen. Apply sunscreen liberally and repeatedly throughout the day.  Whenever you are outside, protect yourself by wearing long sleeves, pants, a wide-brimmed hat, and sunglasses.  What should I know about osteoporosis? Osteoporosis is a condition in which bone destruction happens more quickly than new bone creation. After menopause, you may be at an increased risk for osteoporosis. To help prevent osteoporosis or the bone fractures that can happen because of osteoporosis, the following is recommended:  If you are 40-4 years old, get at least 1,000 mg of calcium and at least 600 mg of vitamin D per day.  If you are older than age 56 but younger than age 69, get at least 1,200 mg of calcium and at least 600 mg of vitamin D per day.  If you are older than age 57, get at least 1,200 mg of calcium and at least 800 mg of vitamin D per day.  Smoking and excessive alcohol intake  increase the risk of osteoporosis. Eat foods that are rich in calcium and vitamin D, and do weight-bearing exercises several times each week as directed by your health care provider. What should I know about how menopause affects my mental health? Depression may occur at any age, but it is more common as you become older. Common symptoms of depression include:  Low or sad mood.  Changes in sleep patterns.  Changes in appetite or eating patterns.  Feeling an overall lack of motivation or enjoyment of activities that you previously enjoyed.  Frequent crying spells.  Talk with your health care provider if you think that you are experiencing depression. What should I know about immunizations? It is important that you get and maintain your immunizations. These include:  Tetanus, diphtheria, and pertussis (Tdap) booster vaccine.  Influenza every year before the flu season begins.  Pneumonia vaccine.  Shingles vaccine.  Your health care provider may also recommend other immunizations. This information is not intended to replace advice given to you by your health care provider. Make sure you discuss any questions you have with your health care provider. Document Released: 06/06/2005 Document Revised: 11/02/2015 Document Reviewed: 01/16/2015 Elsevier Interactive Patient Education  2018 [employer]  

## 2016-10-22 ENCOUNTER — Other Ambulatory Visit: Payer: Medicare Other

## 2016-10-23 LAB — LIPID PANEL
CHOL/HDL RATIO: 2.3 ratio (ref 0.0–4.4)
Cholesterol, Total: 166 mg/dL (ref 100–199)
HDL: 71 mg/dL (ref >39)
LDL Calculated: 84 mg/dL (ref 0–99)
Triglycerides: 55 mg/dL (ref 0–149)
VLDL CHOLESTEROL CAL: 11 mg/dL (ref 5–40)

## 2016-10-23 LAB — TSH: TSH: 2.69 u[IU]/mL (ref 0.450–4.500)

## 2016-11-05 ENCOUNTER — Other Ambulatory Visit: Payer: Self-pay | Admitting: Obstetrics and Gynecology

## 2016-11-05 ENCOUNTER — Ambulatory Visit
Admission: RE | Admit: 2016-11-05 | Discharge: 2016-11-05 | Disposition: A | Discharge status: Home / Self Care | Payer: Medicare Other | Source: Ambulatory Visit | Attending: Obstetrics and Gynecology | Admitting: Obstetrics and Gynecology

## 2016-11-05 DIAGNOSIS — R928 Other abnormal and inconclusive findings on diagnostic imaging of breast: Secondary | ICD-10-CM | POA: Diagnosis not present

## 2016-11-05 DIAGNOSIS — N6310 Unspecified lump in the right breast, unspecified quadrant: Secondary | ICD-10-CM

## 2016-11-05 DIAGNOSIS — Z9882 Breast implant status: Secondary | ICD-10-CM | POA: Diagnosis not present

## 2016-12-11 ENCOUNTER — Ambulatory Visit
Admission: RE | Admit: 2016-12-11 | Discharge: 2016-12-11 | Disposition: A | Discharge status: Home / Self Care | Payer: Medicare Other | Source: Ambulatory Visit | Attending: Obstetrics and Gynecology | Admitting: Obstetrics and Gynecology

## 2016-12-11 DIAGNOSIS — Z8262 Family history of osteoporosis: Secondary | ICD-10-CM

## 2016-12-11 DIAGNOSIS — M818 Other osteoporosis without current pathological fracture: Secondary | ICD-10-CM | POA: Diagnosis not present

## 2016-12-15 DIAGNOSIS — L820 Inflamed seborrheic keratosis: Secondary | ICD-10-CM | POA: Diagnosis not present

## 2016-12-23 ENCOUNTER — Encounter: Payer: Self-pay | Admitting: Obstetrics and Gynecology

## 2016-12-23 NOTE — Addendum Note (Signed)
Addended by: Marchelle Folks on: 12/23/2016 11:57 AM   Modules accepted: Orders

## 2017-03-20 HISTORY — PX: PARATHYROIDECTOMY: SHX19

## 2017-03-20 HISTORY — PX: THYROID LOBECTOMY: SHX420

## 2017-03-21 ENCOUNTER — Other Ambulatory Visit
Admission: RE | Admit: 2017-03-21 | Discharge: 2017-03-21 | Disposition: A | Discharge status: Home / Self Care | Payer: Medicare Other | Source: Ambulatory Visit | Attending: Bariatrics | Admitting: Bariatrics

## 2017-03-21 DIAGNOSIS — Z9089 Acquired absence of other organs: Secondary | ICD-10-CM | POA: Insufficient documentation

## 2017-03-21 LAB — CALCIUM: CALCIUM: 9.1 mg/dL (ref 8.9–10.3)

## 2017-04-16 ENCOUNTER — Other Ambulatory Visit: Payer: No Typology Code available for payment source

## 2017-04-27 ENCOUNTER — Ambulatory Visit
Admission: RE | Admit: 2017-04-27 | Discharge: 2017-04-27 | Disposition: A | Discharge status: Home / Self Care | Payer: Medicare Other | Source: Ambulatory Visit | Attending: Obstetrics and Gynecology | Admitting: Obstetrics and Gynecology

## 2017-04-27 DIAGNOSIS — N6310 Unspecified lump in the right breast, unspecified quadrant: Secondary | ICD-10-CM

## 2017-10-13 NOTE — Progress Notes (Signed)
Patient ID: Tracey Jensen, female   DOB: [birth year], 67 y.o.   MRN: [id number] ANNUAL PREVENTATIVE CARE GYN  ENCOUNTER NOTE  Subjective:       Tracey DrainJulia D Jensen is a 67 y.o. [phone] female here for a routine annual gynecologic exam.  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  Today she presents to the office with no new complaints or issues.  She has mild vasomotor symptoms that are somewhat controlled on ERT (estradiol 1mg  daily); she notes only occasional night sweats without hot flashes..  She denies and urinary symptoms, no changes in bowel or bladder function, no N/V/D or constipation.  Denies dyspareunia or vaginal dryness.    In November 2018 patient underwent thyroidectomy and parathyroidectomy; she currently is on Synthroid 75 mcg a day. She is exercising but has some increased fatigue that makes it more challenging.    Gynecologic History No LMP recorded. Patient has had a hysterectomy. Contraception: status post hysterectomy Status post breast augmentation- 10/2016 birad 3  Obstetric History OB History  Gravida Para Term Preterm AB Living  2 2 2     2   SAB TAB Ectopic Multiple Live Births          2    # Outcome Date GA Lbr Len/2nd Weight Sex Delivery Anes PTL Lv  2 Term 1977   7 lb 14.4 oz (3.583 kg) F    LIV  1 Term 1972   7 lb 11.2 oz (3.493 kg) M Vag-Spont   LIV    Past Medical History:  Diagnosis Date  . Blood transfusion without reported diagnosis   . Iron deficiency anemia due to dietary causes 10/03/2014  . Sleep apnea    history of prior to surgery    Past Surgical History:  Procedure Laterality Date  . AUGMENTATION MAMMAPLASTY Bilateral 09/2015   mastopexy and silicone implants  . BARIATRIC SURGERY  09/2012  . BELPHAROPTOSIS REPAIR    . BRACHIOPLASTY  06/2015  . BREAST CYST ASPIRATION Right   . BREAST ENHANCEMENT SURGERY  06/2015  . breast lift  06/2015  . COLONOSCOPY WITH  PROPOFOL N/A 11/08/2015   Procedure: COLONOSCOPY WITH PROPOFOL;  Surgeon: Midge Miniumarren Wohl, MD;  [facility];  Service: Endoscopy;  Laterality: N/A;  . LAPAROSCOPIC CHOLECYSTECTOMY  09/1996  . lower body lift  12/2014  . TOTAL VAGINAL HYSTERECTOMY  12/2013    Current Outpatient Medications on File Prior to Visit  Medication Sig Dispense Refill  . b complex vitamins capsule Take 1 capsule by mouth daily.    . Calcium Carb-Cholecalciferol (CALCIUM 1000 + D PO) Take 1,000 mg by mouth daily.    Marland Kitchen. estradiol (ESTRACE) 1 MG tablet Take 1 tablet (1 mg total) by mouth daily. 90 tablet 4  . Multiple Vitamin (MULTIVITAMIN) capsule Take 1 capsule by mouth daily.    . vitamin B-12 (CYANOCOBALAMIN) 500 MCG tablet Take 500 mcg by mouth daily.     No current facility-administered medications on file prior to visit.     No Known Allergies  Social History   Socioeconomic History  . Marital status: Married    Spouse name: Not on file  . Number of children: 2  . Years of education: Not on file  . Highest education level: Not on file  Occupational History  . Occupation: retired    Associate Professormployer: [employer]  Social Needs  . Financial resource strain: Not on file  .  Food insecurity:    Worry: Not on file    Inability: Not on file  . Transportation needs:    Medical: Not on file    Non-medical: Not on file  Tobacco Use  . Smoking status: Former Games developer  . Smokeless tobacco: Never Used  . Tobacco comment: quit 35+ yrs ago  Substance and Sexual Activity  . Alcohol use: No    Alcohol/week: 0.0 oz  . Drug use: No  . Sexual activity: Yes    Partners: Male    Birth control/protection: Surgical  Lifestyle  . Physical activity:    Days per week: Not on file    Minutes per session: Not on file  . Stress: Not on file  Relationships  . Social connections:    Talks on phone: Not on file    Gets together: Not on file    Attends religious service: Not on file    Active member of club or  organization: Not on file    Attends meetings of clubs or organizations: Not on file    Relationship status: Not on file  . Intimate partner violence:    Fear of current or ex partner: Not on file    Emotionally abused: Not on file    Physically abused: Not on file    Forced sexual activity: Not on file  Other Topics Concern  . Not on file  Social History Narrative  . Not on file    Family History  Problem Relation Age of Onset  . Arthritis Mother   . Hyperlipidemia Mother   . Cancer Father   . Hyperlipidemia Father   . Vision loss Father   . Cancer Paternal Grandmother   . Cancer Paternal Grandfather   . Stroke Paternal Grandfather   . Arthritis Maternal Grandmother   . Stroke Maternal Grandmother   . Lung cancer Sister   . Breast cancer Neg Hx   . Diabetes Neg Hx     The following portions of the patient's history were reviewed and updated as appropriate: allergies, current medications, past family history, past medical history, past social history, past surgical history and problem list.  Review of Systems Review of Systems  Constitutional: Positive for malaise/fatigue.       Occasional vasomotor symptom  HENT: Negative.   Eyes: Negative.   Cardiovascular: Negative.   Gastrointestinal: Negative.   Genitourinary: Negative.   Musculoskeletal: Negative.   Skin: Negative.        Status post excision of redundant skin of bilateral thighs  Neurological: Negative.   Endo/Heme/Allergies: Negative.   Psychiatric/Behavioral: Negative.        Objective:  BP 111/69   Pulse 73   Ht 5\' 6"  (1.676 m)   Wt 177 lb 9.6 oz (80.6 kg)   BMI 28.67 kg/m  CONSTITUTIONAL: Well-developed, well-nourished female in no acute distress.  PSYCHIATRIC: Normal mood and affect. Normal behavior. Normal judgment and thought content. NEUROLGIC: Alert and oriented to person, place, and time. Normal muscle tone coordination. No cranial nerve deficit noted. HENT:  Normocephalic, atraumatic,  External right and left ear normal. EYES: Conjunctivae and EOM are normal.  No scleral icterus.  NECK: Normal range of motion, supple, no masses.  Normal thyroid.  SKIN: Skin is warm and dry. No rash noted. Not diaphoretic. No erythema. No pallor. CARDIOVASCULAR: Normal heart rate noted, regular rhythm, no murmur. RESPIRATORY: Clear to auscultation bilaterally. Effort and breath sounds normal, no problems with respiration noted. BREASTS: Symmetric in size.bilateral breast implants with  inframammary scars well healed. No skin changes, nipple drainage, or lymphadenopathy.   ABDOMEN: Soft,  no distention noted.  No tenderness, rebound or guarding.  BLADDER: Normal PELVIC: (Unchanged from 2018)  External Genitalia: Normal  BUS: Normal  Vagina: good estrogen effect;first degree cystocele with excellent support at the urethrovesical junction;no significant rectocele; excess vaginal mucosa arising from the apex possibly consistent with enterocele although no palpable bowel can be appreciated within the redundant tissue  Cervix: surgically absent  Uterus: surgically absent  Adnexa: Normal  RV: External Exam NormaI, No Rectal Masses and Normal Sphincter tone  MUSCULOSKELETAL: Normal range of motion. No tenderness.  No cyanosis, clubbing, or edema.  2+ distal pulses.  Medial thigh incisions well-healed bilaterally LYMPHATIC: No Axillary, Supraclavicular, or Inguinal Adenopathy.    Assessment:   Annual gynecologic examination 67 y.o. 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 Status post thyroidectomy and parathyroidectomy 2018 Redundant vaginal mucosa at the vaginal apex likely secondary to limited tissue removed at the time of vaginal repair and due to the patient's extreme weight loss prior to surgery (with extreme tissue redundency); this does not appear to be enterocele; asymptomatic  Plan:  Pap: not needed Mammogram: bilateral dx and rt breast  u/s needed Stool Guaiac Testing:ordered Labs: screening labs ordered- lipids, A1C Routine preventative health maintenance measures emphasized: Exercise/Diet/Weight control, Tobacco Warnings and Alcohol/Substance use risks Refill estradiol 1 mg daily Return to Clinic - 1 Year   Crystal [place], CMA  Herold Harms, MD  Note: This dictation was prepared with Dragon dictation along with smaller phrase technology. Any transcriptional errors that result from this process are unintentional.

## 2017-10-22 ENCOUNTER — Encounter: Payer: Self-pay | Admitting: Obstetrics and Gynecology

## 2017-10-22 ENCOUNTER — Ambulatory Visit (INDEPENDENT_AMBULATORY_CARE_PROVIDER_SITE_OTHER): Payer: Medicare HMO | Admitting: Obstetrics and Gynecology

## 2017-10-22 VITALS — BP 111/69 | HR 73 | Ht 66.0 in | Wt 177.6 lb

## 2017-10-22 DIAGNOSIS — Z79890 Hormone replacement therapy (postmenopausal): Secondary | ICD-10-CM | POA: Diagnosis not present

## 2017-10-22 DIAGNOSIS — Z90722 Acquired absence of ovaries, bilateral: Secondary | ICD-10-CM

## 2017-10-22 DIAGNOSIS — K469 Unspecified abdominal hernia without obstruction or gangrene: Secondary | ICD-10-CM

## 2017-10-22 DIAGNOSIS — Z90710 Acquired absence of both cervix and uterus: Secondary | ICD-10-CM | POA: Diagnosis not present

## 2017-10-22 DIAGNOSIS — Z1239 Encounter for other screening for malignant neoplasm of breast: Secondary | ICD-10-CM

## 2017-10-22 DIAGNOSIS — Z1211 Encounter for screening for malignant neoplasm of colon: Secondary | ICD-10-CM

## 2017-10-22 DIAGNOSIS — Z8262 Family history of osteoporosis: Secondary | ICD-10-CM

## 2017-10-22 DIAGNOSIS — E8940 Asymptomatic postprocedural ovarian failure: Secondary | ICD-10-CM | POA: Diagnosis not present

## 2017-10-22 DIAGNOSIS — Z1231 Encounter for screening mammogram for malignant neoplasm of breast: Secondary | ICD-10-CM | POA: Diagnosis not present

## 2017-10-22 DIAGNOSIS — R5383 Other fatigue: Secondary | ICD-10-CM

## 2017-10-22 DIAGNOSIS — Z01411 Encounter for gynecological examination (general) (routine) with abnormal findings: Secondary | ICD-10-CM

## 2017-10-22 DIAGNOSIS — Z9079 Acquired absence of other genital organ(s): Secondary | ICD-10-CM

## 2017-10-22 DIAGNOSIS — Z01419 Encounter for gynecological examination (general) (routine) without abnormal findings: Secondary | ICD-10-CM

## 2017-10-22 MED ORDER — ESTRADIOL 1 MG PO TABS: 1.0000 mg | ORAL_TABLET | Freq: Every day | ORAL | 4 refills | Status: DC

## 2017-10-22 NOTE — Patient Instructions (Signed)
1.  Pap smear is not needed. 2.  Mammogram is ordered 3.  Stool guaiac card testing for colon cancer screening is ordered 4.  Screening labs are ordered. 5.  Continue with healthy eating and exercise. 6.  Continue with calcium and vitamin D supplementation twice a day. 7.  Return in 1 year for annual exam   Health Maintenance for Postmenopausal Women Menopause is a normal process in which your reproductive ability comes to an end. This process happens gradually over a span of months to years, usually between the ages of 78 and 60. Menopause is complete when you have missed 12 consecutive menstrual periods. It is important to talk with your health care provider about some of the most common conditions that affect postmenopausal women, such as heart disease, cancer, and bone loss (osteoporosis). Adopting a healthy lifestyle and getting preventive care can help to promote your health and wellness. Those actions can also lower your chances of developing some of these common conditions. What should I know about menopause? During menopause, you may experience a number of symptoms, such as:  Moderate-to-severe hot flashes.  Night sweats.  Decrease in sex drive.  Mood swings.  Headaches.  Tiredness.  Irritability.  Memory problems.  Insomnia.  Choosing to treat or not to treat menopausal changes is an individual decision that you make with your health care provider. What should I know about hormone replacement therapy and supplements? Hormone therapy products are effective for treating symptoms that are associated with menopause, such as hot flashes and night sweats. Hormone replacement carries certain risks, especially as you become older. If you are thinking about using estrogen or estrogen with progestin treatments, discuss the benefits and risks with your health care provider. What should I know about heart disease and stroke? Heart disease, heart attack, and stroke become more  likely as you age. This may be due, in part, to the hormonal changes that your body experiences during menopause. These can affect how your body processes dietary fats, triglycerides, and cholesterol. Heart attack and stroke are both medical emergencies. There are many things that you can do to help prevent heart disease and stroke:  Have your blood pressure checked at least every 1-2 years. High blood pressure causes heart disease and increases the risk of stroke.  If you are 13-48 years old, ask your health care provider if you should take aspirin to prevent a heart attack or a stroke.  Do not use any tobacco products, including cigarettes, chewing tobacco, or electronic cigarettes. If you need help quitting, ask your health care provider.  It is important to eat a healthy diet and maintain a healthy weight. ? Be sure to include plenty of vegetables, fruits, low-fat dairy products, and lean protein. ? Avoid eating foods that are high in solid fats, added sugars, or salt (sodium).  Get regular exercise. This is one of the most important things that you can do for your health. ? Try to exercise for at least 150 minutes each week. The type of exercise that you do should increase your heart rate and make you sweat. This is known as moderate-intensity exercise. ? Try to do strengthening exercises at least twice each week. Do these in addition to the moderate-intensity exercise.  Know your numbers.Ask your health care provider to check your cholesterol and your blood glucose. Continue to have your blood tested as directed by your health care provider.  What should I know about cancer screening? There are several types of cancer.  Take the following steps to reduce your risk and to catch any cancer development as early as possible. Breast Cancer  Practice breast self-awareness. ? This means understanding how your breasts normally appear and feel. ? It also means doing regular breast self-exams.  Let your health care provider know about any changes, no matter how small.  If you are 27 or older, have a clinician do a breast exam (clinical breast exam or CBE) every year. Depending on your age, family history, and medical history, it may be recommended that you also have a yearly breast X-ray (mammogram).  If you have a family history of breast cancer, talk with your health care provider about genetic screening.  If you are at high risk for breast cancer, talk with your health care provider about having an MRI and a mammogram every year.  Breast cancer (BRCA) gene test is recommended for women who have family members with BRCA-related cancers. Results of the assessment will determine the need for genetic counseling and BRCA1 and for BRCA2 testing. BRCA-related cancers include these types: ? Breast. This occurs in males or females. ? Ovarian. ? Tubal. This may also be called fallopian tube cancer. ? Cancer of the abdominal or pelvic lining (peritoneal cancer). ? Prostate. ? Pancreatic.  Cervical, Uterine, and Ovarian Cancer Your health care provider may recommend that you be screened regularly for cancer of the pelvic organs. These include your ovaries, uterus, and vagina. This screening involves a pelvic exam, which includes checking for microscopic changes to the surface of your cervix (Pap test).  For women ages 21-65, health care providers may recommend a pelvic exam and a Pap test every three years. For women ages 36-65, they may recommend the Pap test and pelvic exam, combined with testing for human papilloma virus (HPV), every five years. Some types of HPV increase your risk of cervical cancer. Testing for HPV may also be done on women of any age who have unclear Pap test results.  Other health care providers may not recommend any screening for nonpregnant women who are considered low risk for pelvic cancer and have no symptoms. Ask your health care provider if a screening pelvic exam  is right for you.  If you have had past treatment for cervical cancer or a condition that could lead to cancer, you need Pap tests and screening for cancer for at least 20 years after your treatment. If Pap tests have been discontinued for you, your risk factors (such as having a new sexual partner) need to be reassessed to determine if you should start having screenings again. Some women have medical problems that increase the chance of getting cervical cancer. In these cases, your health care provider may recommend that you have screening and Pap tests more often.  If you have a family history of uterine cancer or ovarian cancer, talk with your health care provider about genetic screening.  If you have vaginal bleeding after reaching menopause, tell your health care provider.  There are currently no reliable tests available to screen for ovarian cancer.  Lung Cancer Lung cancer screening is recommended for adults 39-40 years old who are at high risk for lung cancer because of a history of smoking. A yearly low-dose CT scan of the lungs is recommended if you:  Currently smoke.  Have a history of at least 30 pack-years of smoking and you currently smoke or have quit within the past 15 years. A pack-year is smoking an average of one pack of cigarettes  per day for one year.  Yearly screening should:  Continue until it has been 15 years since you quit.  Stop if you develop a health problem that would prevent you from having lung cancer treatment.  Colorectal Cancer  This type of cancer can be detected and can often be prevented.  Routine colorectal cancer screening usually begins at age 18 and continues through age 70.  If you have risk factors for colon cancer, your health care provider may recommend that you be screened at an earlier age.  If you have a family history of colorectal cancer, talk with your health care provider about genetic screening.  Your health care provider may also  recommend using home test kits to check for hidden blood in your stool.  A small camera at the end of a tube can be used to examine your colon directly (sigmoidoscopy or colonoscopy). This is done to check for the earliest forms of colorectal cancer.  Direct examination of the colon should be repeated every 5-10 years until age 40. However, if early forms of precancerous polyps or small growths are found or if you have a family history or genetic risk for colorectal cancer, you may need to be screened more often.  Skin Cancer  Check your skin from head to toe regularly.  Monitor any moles. Be sure to tell your health care provider: ? About any new moles or changes in moles, especially if there is a change in a mole's shape or color. ? If you have a mole that is larger than the size of a pencil eraser.  If any of your family members has a history of skin cancer, especially at a young age, talk with your health care provider about genetic screening.  Always use sunscreen. Apply sunscreen liberally and repeatedly throughout the day.  Whenever you are outside, protect yourself by wearing long sleeves, pants, a wide-brimmed hat, and sunglasses.  What should I know about osteoporosis? Osteoporosis is a condition in which bone destruction happens more quickly than new bone creation. After menopause, you may be at an increased risk for osteoporosis. To help prevent osteoporosis or the bone fractures that can happen because of osteoporosis, the following is recommended:  If you are 22-52 years old, get at least 1,000 mg of calcium and at least 600 mg of vitamin D per day.  If you are older than age 67 but younger than age 93, get at least 1,200 mg of calcium and at least 600 mg of vitamin D per day.  If you are older than age 65, get at least 1,200 mg of calcium and at least 800 mg of vitamin D per day.  Smoking and excessive alcohol intake increase the risk of osteoporosis. Eat foods that are  rich in calcium and vitamin D, and do weight-bearing exercises several times each week as directed by your health care provider. What should I know about how menopause affects my mental health? Depression may occur at any age, but it is more common as you become older. Common symptoms of depression include:  Low or sad mood.  Changes in sleep patterns.  Changes in appetite or eating patterns.  Feeling an overall lack of motivation or enjoyment of activities that you previously enjoyed.  Frequent crying spells.  Talk with your health care provider if you think that you are experiencing depression. What should I know about immunizations? It is important that you get and maintain your immunizations. These include:  Tetanus, diphtheria, and  pertussis (Tdap) booster vaccine.  Influenza every year before the flu season begins.  Pneumonia vaccine.  Shingles vaccine.  Your health care provider may also recommend other immunizations. This information is not intended to replace advice given to you by your health care provider. Make sure you discuss any questions you have with your health care provider. Document Released: 06/06/2005 Document Revised: 11/02/2015 Document Reviewed: 01/16/2015 Elsevier Interactive Patient Education  2018 [employer].

## 2017-10-27 ENCOUNTER — Other Ambulatory Visit: Payer: Self-pay | Admitting: Obstetrics and Gynecology

## 2017-10-27 ENCOUNTER — Encounter: Payer: Self-pay | Admitting: Obstetrics and Gynecology

## 2017-10-27 DIAGNOSIS — N6310 Unspecified lump in the right breast, unspecified quadrant: Secondary | ICD-10-CM

## 2017-10-28 ENCOUNTER — Other Ambulatory Visit: Payer: Medicare HMO

## 2017-10-29 LAB — LIPID PANEL
Chol/HDL Ratio: 2.4 ratio (ref 0.0–4.4)
Cholesterol, Total: 175 mg/dL (ref 100–199)
HDL: 72 mg/dL (ref >39)
LDL CALC: 93 mg/dL (ref 0–99)
Triglycerides: 50 mg/dL (ref 0–149)
VLDL CHOLESTEROL CAL: 10 mg/dL (ref 5–40)

## 2017-10-29 LAB — HEMOGLOBIN A1C
Est. average glucose Bld gHb Est-mCnc: 100 mg/dL
HEMOGLOBIN A1C: 5.1 % (ref 4.8–5.6)

## 2017-11-17 ENCOUNTER — Ambulatory Visit
Admission: RE | Admit: 2017-11-17 | Discharge: 2017-11-17 | Disposition: A | Discharge status: Home / Self Care | Payer: Medicare HMO | Source: Ambulatory Visit | Attending: Obstetrics and Gynecology | Admitting: Obstetrics and Gynecology

## 2017-11-17 DIAGNOSIS — N6310 Unspecified lump in the right breast, unspecified quadrant: Secondary | ICD-10-CM

## 2018-10-27 ENCOUNTER — Ambulatory Visit (INDEPENDENT_AMBULATORY_CARE_PROVIDER_SITE_OTHER): Payer: Medicare HMO | Admitting: Obstetrics and Gynecology

## 2018-10-27 ENCOUNTER — Encounter: Payer: Medicare HMO | Admitting: Obstetrics and Gynecology

## 2018-10-27 ENCOUNTER — Other Ambulatory Visit: Payer: Self-pay

## 2018-10-27 ENCOUNTER — Encounter: Payer: Self-pay | Admitting: Obstetrics and Gynecology

## 2018-10-27 VITALS — BP 130/78 | HR 76 | Ht 67.0 in | Wt 186.7 lb

## 2018-10-27 DIAGNOSIS — Z79890 Hormone replacement therapy (postmenopausal): Secondary | ICD-10-CM | POA: Diagnosis not present

## 2018-10-27 DIAGNOSIS — Z1239 Encounter for other screening for malignant neoplasm of breast: Secondary | ICD-10-CM

## 2018-10-27 DIAGNOSIS — Z01419 Encounter for gynecological examination (general) (routine) without abnormal findings: Secondary | ICD-10-CM | POA: Diagnosis not present

## 2018-10-27 MED ORDER — ESTRADIOL 1 MG PO TABS: 1.0000 mg | ORAL_TABLET | Freq: Every day | ORAL | 4 refills | Status: DC

## 2018-10-27 NOTE — Progress Notes (Signed)
HPI:      [name] is a 68 y.o. G2P2002 who LMP was No LMP recorded. Patient has had a hysterectomy.  Subjective:   She presents today for her annual examination.  She has no complaints today. She continues to take calcium vitamin D estrogen and Synthroid. She has a history of bariatric surgery with significant weight loss.  She is currently finding it difficult to exercise because of COVID-19. She reports no pelvic symptoms at this time with the exception of a small amount of anal incontinence immediately after bowel movements.    Hx: The following portions of the patient's history were reviewed and updated as appropriate:             She  has a past medical history of Blood transfusion without reported diagnosis, Iron deficiency anemia due to dietary causes (10/03/2014), Sleep apnea, and Thyroid disease. She does not have any pertinent problems on file. She  has a past surgical history that includes Total vaginal hysterectomy (12/2013); Bariatric Surgery (09/2012); Laparoscopic cholecystectomy (09/1996); lower body lift (12/2014); Brachioplasty (06/2015); breast lift (06/2015); Breast enhancement surgery (06/2015); Colonoscopy with propofol (N/A, 11/08/2015); Augmentation mammaplasty (Bilateral, 09/2015); Blepharoptosis repair; Breast cyst aspiration (Right); Thyroid lobectomy (Right); and Parathyroidectomy (Left). Her family history includes Arthritis in her maternal grandmother and mother; Cancer in her father, paternal grandfather, and paternal grandmother; Hyperlipidemia in her father and mother; Lung cancer in her sister; Stroke in her maternal grandmother and paternal grandfather; Vision loss in her father. She  reports that she has quit smoking. She has never used smokeless tobacco. She reports that she does not drink alcohol or use drugs. She has a current medication list which includes the following prescription(s): b complex vitamins, calcium carb-cholecalciferol, estradiol, multivitamin,  vitamin b-12, and levothyroxine. She has No Known Allergies.       Review of Systems:  Review of Systems  Constitutional: Denied constitutional symptoms, night sweats, recent illness, fatigue, fever, insomnia and weight loss.  Eyes: Denied eye symptoms, eye pain, photophobia, vision change and visual disturbance.  Ears/Nose/Throat/Neck: Denied ear, nose, throat or neck symptoms, hearing loss, nasal discharge, sinus congestion and sore throat.  Cardiovascular: Denied cardiovascular symptoms, arrhythmia, chest pain/pressure, edema, exercise intolerance, orthopnea and palpitations.  Respiratory: Denied pulmonary symptoms, asthma, pleuritic pain, productive sputum, cough, dyspnea and wheezing.  Gastrointestinal: Denied, gastro-esophageal reflux, melena, nausea and vomiting.  Genitourinary: Denied genitourinary symptoms including symptomatic vaginal discharge, pelvic relaxation issues, and urinary complaints.  Musculoskeletal: Denied musculoskeletal symptoms, stiffness, swelling, muscle weakness and myalgia.  Dermatologic: Denied dermatology symptoms, rash and scar.  Neurologic: Denied neurology symptoms, dizziness, headache, neck pain and syncope.  Psychiatric: Denied psychiatric symptoms, anxiety and depression.  Endocrine: Denied endocrine symptoms including hot flashes and night sweats.   Meds:   Current Outpatient Medications on File Prior to Visit  Medication Sig Dispense Refill  . b complex vitamins capsule Take 1 capsule by mouth daily.    . Calcium Carb-Cholecalciferol (CALCIUM 1000 + D PO) Take 1,000 mg by mouth daily.    . Multiple Vitamin (MULTIVITAMIN) capsule Take 1 capsule by mouth daily.    . vitamin B-12 (CYANOCOBALAMIN) 500 MCG tablet Take 500 mcg by mouth daily.    Marland Kitchen levothyroxine (SYNTHROID, LEVOTHROID) 75 MCG tablet Take 88 mcg by mouth daily before breakfast.      No current facility-administered medications on file prior to visit.     Objective:     Vitals:    10/27/18 1004  BP: 130/78  Pulse: 76              Physical examination General NAD, Conversant  HEENT Atraumatic; Op clear with mmm.  Normo-cephalic. Pupils reactive. Anicteric sclerae  Thyroid/Neck  previous partial thyroidectomy  Skin  multiple areas with excess skin has been removed  Breasts: No masses or discharge.  Symmetric.  No axillary adenopathy.  Bilateral implants noted  Lungs: Clear to auscultation.No rales or wheezes. Normal Respiratory effort, no retractions.  Heart: NSR.  No murmurs or rubs appreciated. No periferal edema  Abdomen: Soft.  Non-tender.  No masses.  No HSM. No hernia  Extremities: Moves all appropriately.  Normal ROM for age. No lymphadenopathy.  Neuro: Oriented to PPT.  Normal mood. Normal affect.     Pelvic:   Vulva: Normal appearance.  No lesions.  Vagina: No lesions or abnormalities noted.  Support:  Significant pelvic relaxation- 3rd-4th degree rectocele, third-degree cystocele  Urethra No masses tenderness or scarring.  Meatus Normal size without lesions or prolapse.  Cervix:  Surgically absent  Anus: Normal exam.  No lesions.  Perineum: Normal exam.  No lesions.        Bimanual   Uterus:  Surgically absent  Adnexae: No masses.  Non-tender to palpation.  Cul-de-sac: Negative for abnormality.      Assessment:    [id number] Patient Active Problem List   Diagnosis Date Noted  . Special screening for malignant neoplasms, colon   . Chronic constipation 10/03/2014  . Post-menopause on HRT (hormone replacement therapy) 10/03/2014  . S/P TAH-BSO (total abdominal hysterectomy and bilateral salpingo-oophorectomy) 10/03/2014  . Bariatric surgery status 10/03/2014  . Iron deficiency anemia due to dietary causes 10/03/2014     1. Well woman exam with routine gynecological exam   2. Postmenopausal hormone therapy   3. Screening for breast cancer        Plan:            1.  Basic Screening Recommendations The basic screening recommendations  for asymptomatic women were discussed with the patient during her visit.  The age-appropriate recommendations were discussed with her and the rational for the tests reviewed.  When I am informed by the patient that another primary care physician has previously obtained the age-appropriate tests and they are up-to-date, only outstanding tests are ordered and referrals given as necessary.  Abnormal results of tests will be discussed with her when all of her results are completed. Mammogram ordered 2.  Continue ERT 3.  Discussed exercise bone mass calcium and vitamin D 4.  Discussed pelvic relaxation and patient minimally symptomatic so she will contact us if she begins having problems otherwise nothing to do at this time. Orders Orders Placed This Encounter  Procedures  . MM DIGITAL SCREENING BILATERAL     Meds ordered this encounter  Medications  . estradiol (ESTRACE) 1 MG tablet    Sig: Take 1 tablet (1 mg total) by mouth daily.    Dispense:  90 tablet    Refill:  4        F/U  Return in about 1 year (around 10/27/2019) for Annual Physical.  Elonda Huskyavid J. Tawan Corkern, M.D. 10/27/2018 10:34 AM

## 2018-11-28 IMAGING — MG MM  DIGITAL DIAGNOSTIC BREAST BILAT IMPLANT W/ TOMO W/ CAD
8 of 19 series · 8 of 39 positions shown · non-contrast
Comparison: Previous exam(s).

CLINICAL DATA: 67-year-old female presenting for delayed follow-up
of a probably benign right breast mass.

EXAM:
2D DIGITAL DIAGNOSTIC BILATERAL MAMMOGRAM WITH IMPLANTS, CAD AND
ADJUNCT TOMO
The patient has prepectoral implants. Standard and implant displaced
views were performed.

[L CC]
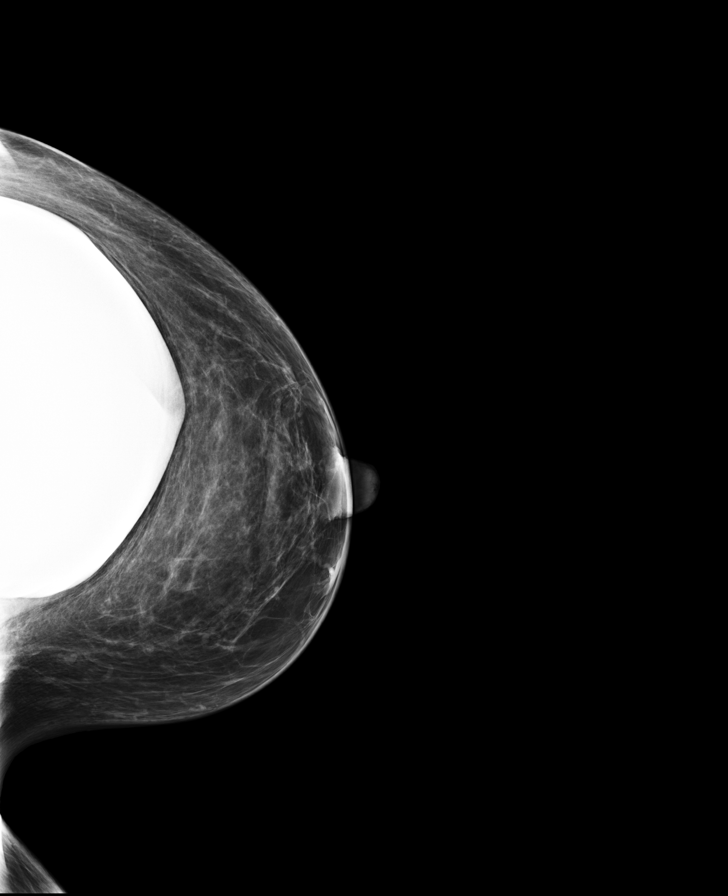

[R MLO]
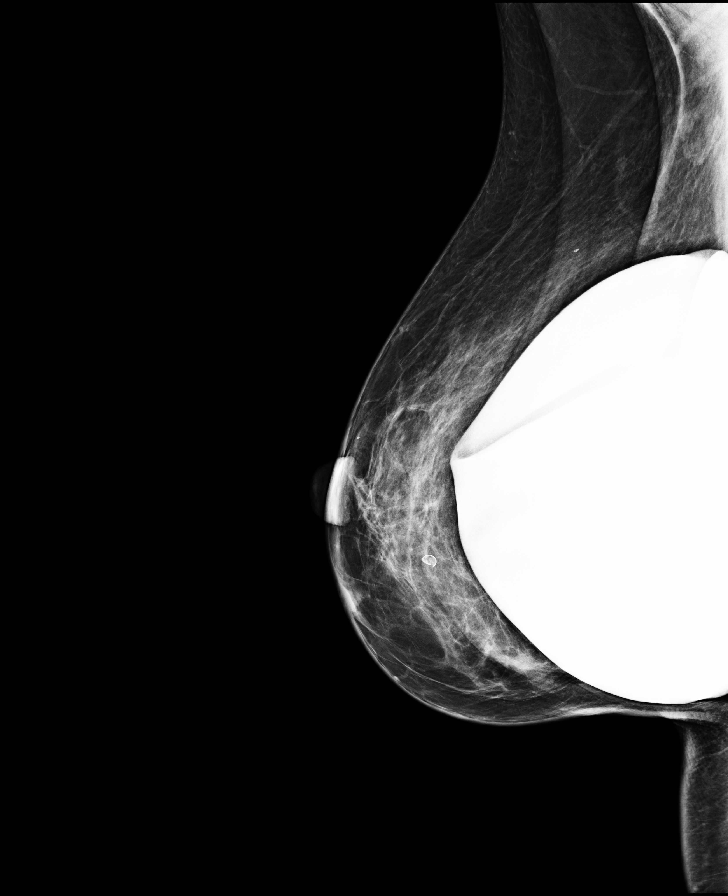

[R CC (1 of 3)]
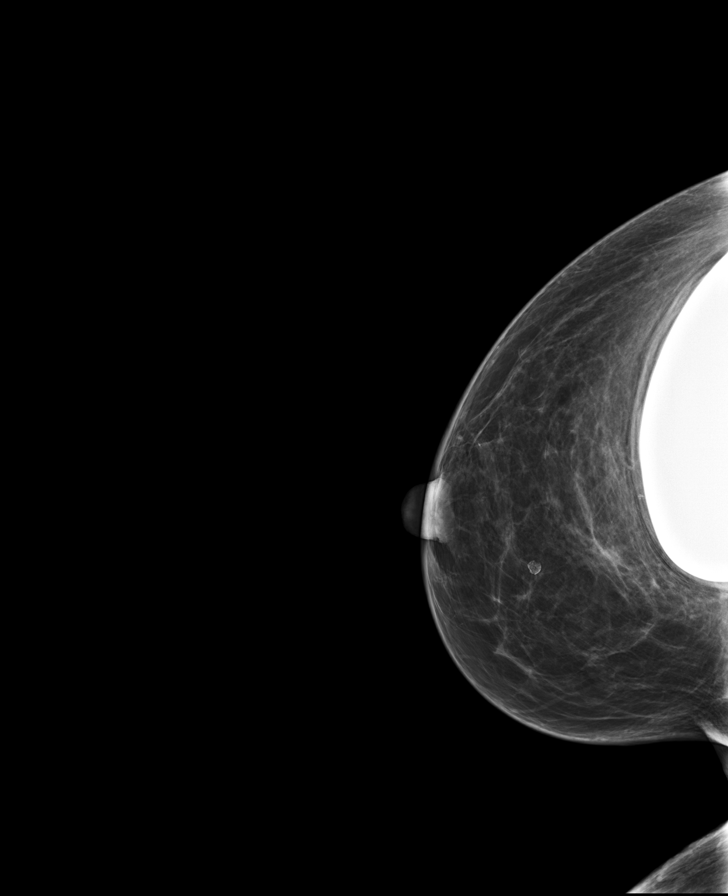

[L MLO]
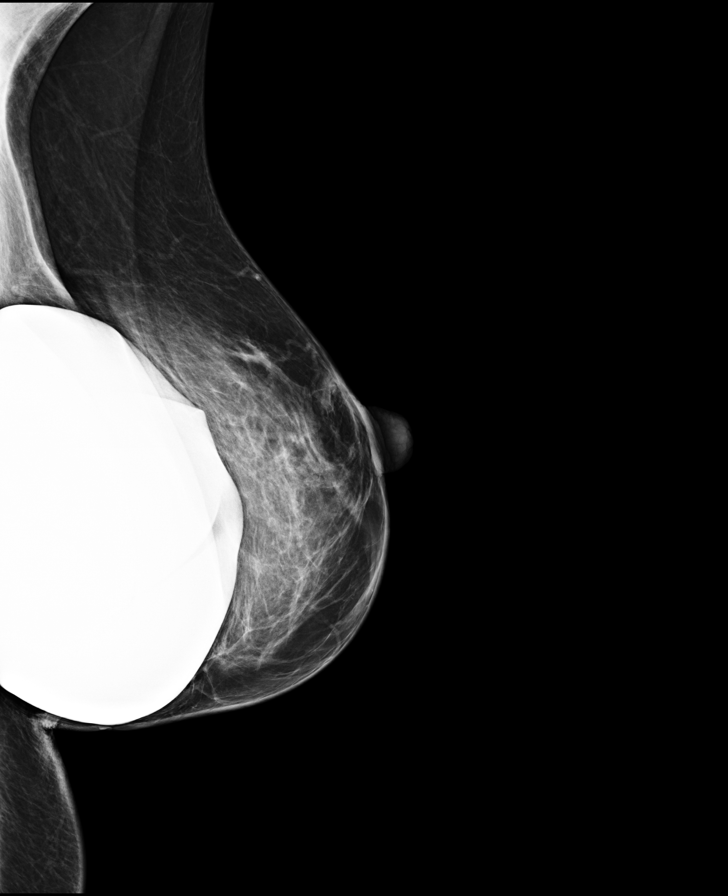

[R CC (2 of 3)]
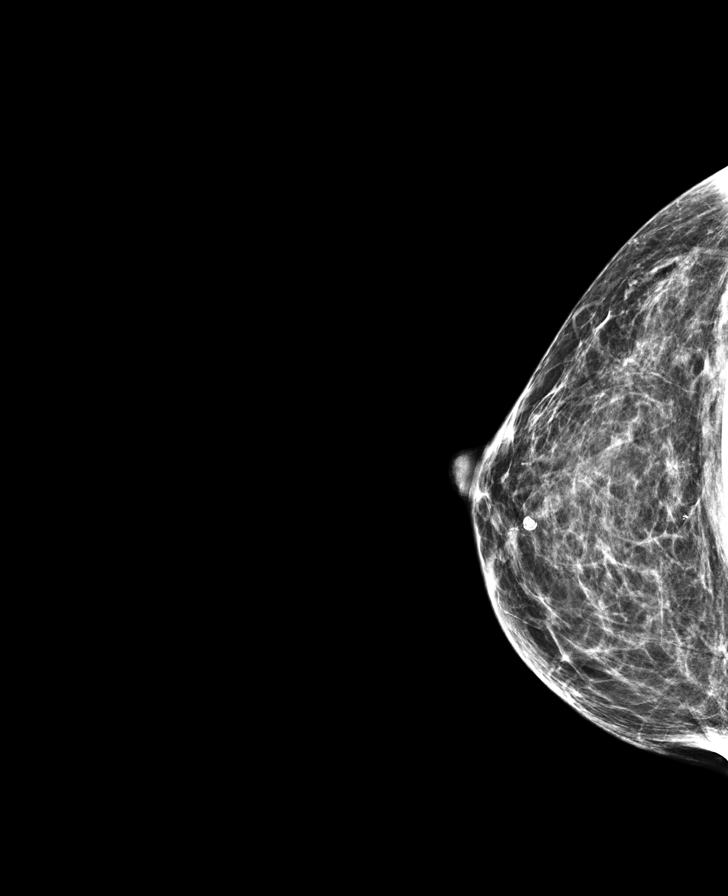

[R CC (3 of 3)]
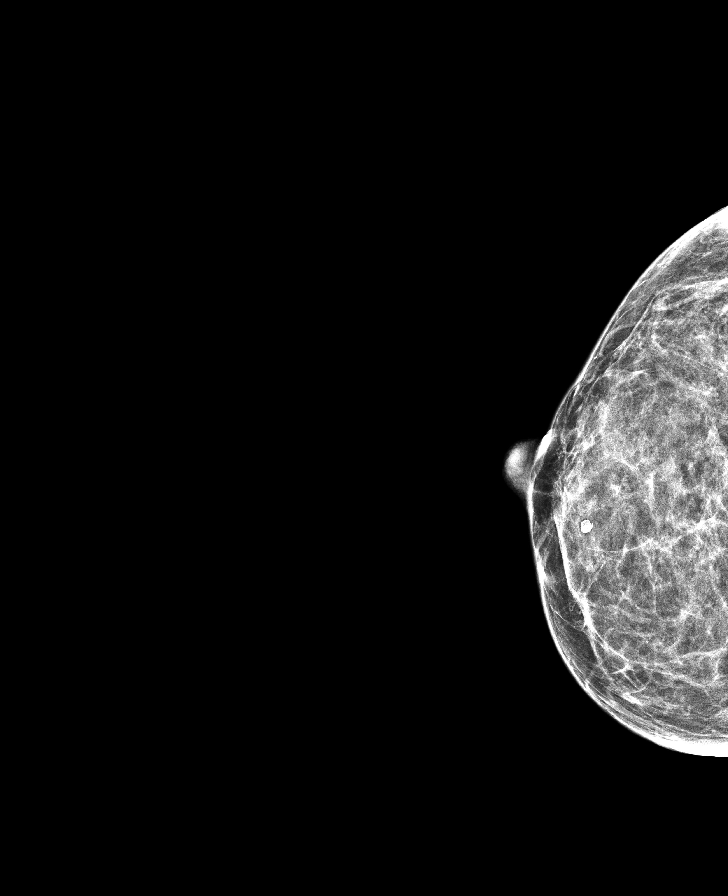

[R CC synth-2D]
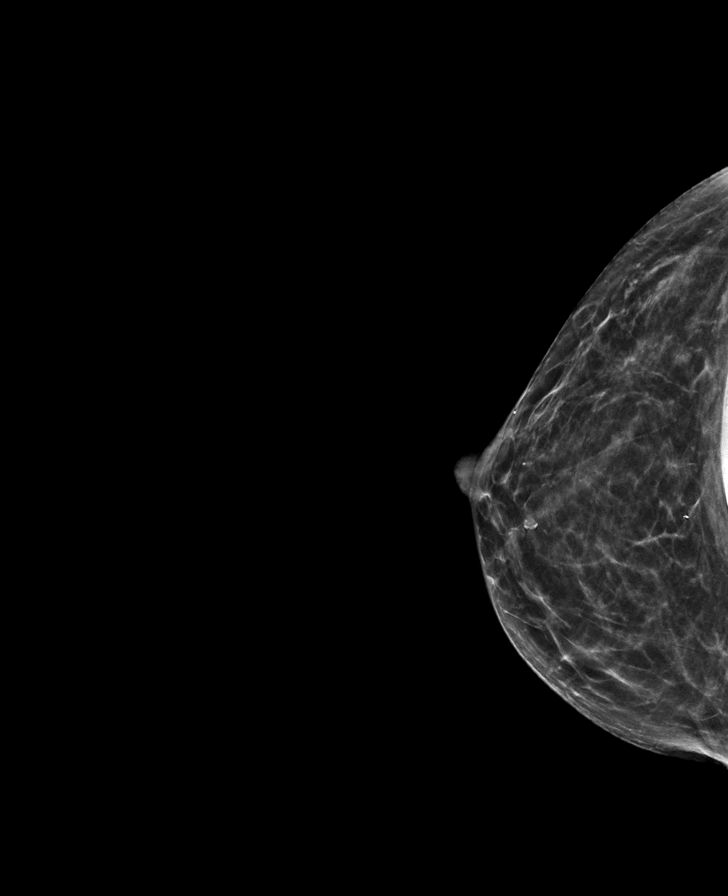

[L MLO synth-2D]
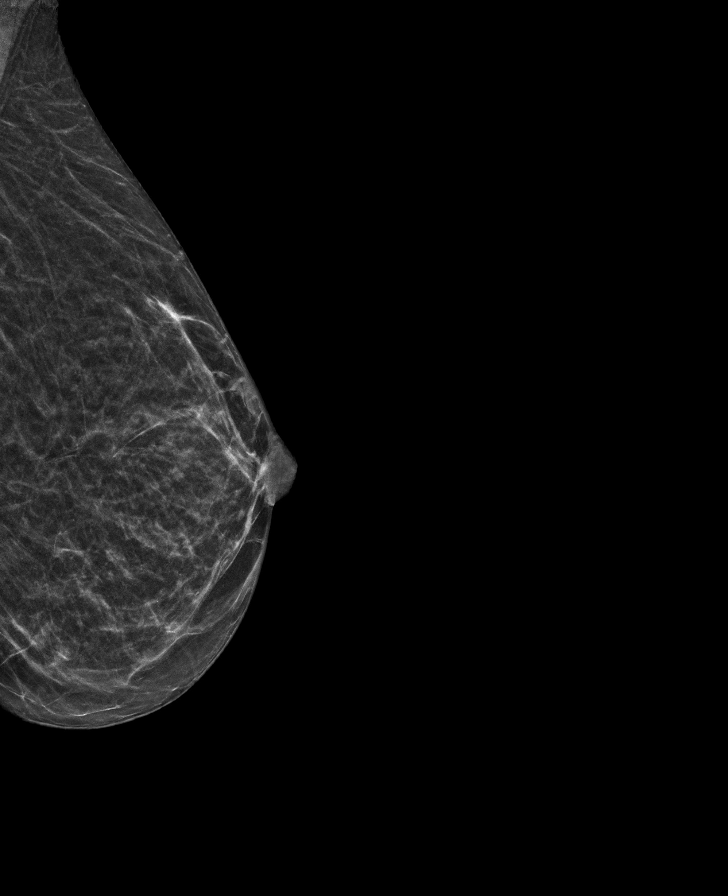

[8 of 39 positions shown; findings below may reference images not displayed]

ACR Breast Density Category b: There are scattered areas of
fibroglandular density.
FINDINGS: There has been interval resolution of the previously seen right
breast mass, likely representing a postoperative seroma. No new or
suspicious mammographic findings are identified in either breast.
The parenchymal pattern is stable.

Mammographic images were processed with CAD.
IMPRESSION: 1. No mammographic evidence of malignancy in either breast.
2. Complete interval resolution of the previously seen right breast
mass, likely representing a postoperative seroma.

RECOMMENDATION:
Screening mammogram in one year.(Code:[id number])

I have discussed the findings and recommendations with the patient.
Results were also provided in writing at the conclusion of the
visit. If applicable, a reminder letter will be sent to the patient
regarding the next appointment.

BI-RADS CATEGORY  1: Negative.

## 2018-12-03 ENCOUNTER — Ambulatory Visit
Admission: RE | Admit: 2018-12-03 | Discharge: 2018-12-03 | Disposition: A | Discharge status: Home / Self Care | Payer: Medicare HMO | Source: Ambulatory Visit | Attending: Obstetrics and Gynecology | Admitting: Obstetrics and Gynecology

## 2018-12-03 DIAGNOSIS — Z1231 Encounter for screening mammogram for malignant neoplasm of breast: Secondary | ICD-10-CM | POA: Diagnosis not present

## 2018-12-03 DIAGNOSIS — Z1239 Encounter for other screening for malignant neoplasm of breast: Secondary | ICD-10-CM

## 2019-10-28 ENCOUNTER — Encounter: Payer: Medicare HMO | Admitting: Obstetrics and Gynecology

## 2019-11-01 ENCOUNTER — Other Ambulatory Visit: Payer: Self-pay

## 2019-11-01 ENCOUNTER — Ambulatory Visit (INDEPENDENT_AMBULATORY_CARE_PROVIDER_SITE_OTHER): Payer: Medicare HMO | Admitting: Obstetrics and Gynecology

## 2019-11-01 ENCOUNTER — Encounter: Payer: Self-pay | Admitting: Obstetrics and Gynecology

## 2019-11-01 VITALS — BP 126/80 | HR 71 | Ht 67.0 in | Wt 198.6 lb

## 2019-11-01 DIAGNOSIS — Z79890 Hormone replacement therapy (postmenopausal): Secondary | ICD-10-CM | POA: Diagnosis not present

## 2019-11-01 DIAGNOSIS — Z1231 Encounter for screening mammogram for malignant neoplasm of breast: Secondary | ICD-10-CM

## 2019-11-01 DIAGNOSIS — B372 Candidiasis of skin and nail: Secondary | ICD-10-CM | POA: Diagnosis not present

## 2019-11-01 DIAGNOSIS — N993 Prolapse of vaginal vault after hysterectomy: Secondary | ICD-10-CM | POA: Diagnosis not present

## 2019-11-01 MED ORDER — ESTRADIOL 1 MG PO TABS: 1.0000 mg | ORAL_TABLET | Freq: Every day | ORAL | 4 refills | Status: DC

## 2019-11-01 NOTE — Progress Notes (Signed)
HPI:      [name] is a 69 y.o. G2P2002 who LMP was No LMP recorded. Patient has had a hysterectomy.  Subjective:   She presents today for her annual examination.  She complains that her right axillary area has been irritated.  She has no other complaints.  She says that her vagina and prolapse issues are no worse than before. She continues to take calcium Synthroid and vitamin D. (Previous thyroid and parathyroid surgery). Has started to consider exercise program now that Covid is resolving. History of weight loss surgery. Patient does see a dermatologist regularly for multiple acanthosis.    Hx: The following portions of the patient's history were reviewed and updated as appropriate:             She  has a past medical history of Blood transfusion without reported diagnosis, Iron deficiency anemia due to dietary causes (10/03/2014), Sleep apnea, and Thyroid disease. She does not have any pertinent problems on file. She  has a past surgical history that includes Total vaginal hysterectomy (12/2013); Bariatric Surgery (09/2012); Laparoscopic cholecystectomy (09/1996); lower body lift (12/2014); Brachioplasty (06/2015); breast lift (06/2015); Breast enhancement surgery (06/2015); Colonoscopy with propofol (N/A, 11/08/2015); Augmentation mammaplasty (Bilateral, 09/2015); Blepharoptosis repair; Breast cyst aspiration (Right); Thyroid lobectomy (Right); and Parathyroidectomy (Left). Her family history includes Arthritis in her maternal grandmother and mother; Cancer in her father, paternal grandfather, and paternal grandmother; Hyperlipidemia in her father and mother; Lung cancer in her sister; Stroke in her maternal grandmother and paternal grandfather; Vision loss in her father. She  reports that she has quit smoking. She has never used smokeless tobacco. She reports that she does not drink alcohol and does not use drugs. She has a current medication list which includes the following prescription(s):  b complex vitamins, calcium carb-cholecalciferol, estradiol, levothyroxine, multivitamin, and vitamin b-12. She has No Known Allergies.       Review of Systems:  Review of Systems  Constitutional: Denied constitutional symptoms, night sweats, recent illness, fatigue, fever, insomnia and weight loss.  Eyes: Denied eye symptoms, eye pain, photophobia, vision change and visual disturbance.  Ears/Nose/Throat/Neck: Denied ear, nose, throat or neck symptoms, hearing loss, nasal discharge, sinus congestion and sore throat.  Cardiovascular: Denied cardiovascular symptoms, arrhythmia, chest pain/pressure, edema, exercise intolerance, orthopnea and palpitations.  Respiratory: Denied pulmonary symptoms, asthma, pleuritic pain, productive sputum, cough, dyspnea and wheezing.  Gastrointestinal: Denied, gastro-esophageal reflux, melena, nausea and vomiting.  Genitourinary: Denied genitourinary symptoms including symptomatic vaginal discharge, pelvic relaxation issues, and urinary complaints.  Musculoskeletal: Denied musculoskeletal symptoms, stiffness, swelling, muscle weakness and myalgia.  Dermatologic: See HPI for additional information.  Neurologic: Denied neurology symptoms, dizziness, headache, neck pain and syncope.  Psychiatric: Denied psychiatric symptoms, anxiety and depression.  Endocrine: Denied endocrine symptoms including hot flashes and night sweats.   Meds:   Current Outpatient Medications on File Prior to Visit  Medication Sig Dispense Refill  . b complex vitamins capsule Take 1 capsule by mouth daily.    . Calcium Carb-Cholecalciferol (CALCIUM 1000 + D PO) Take 1,000 mg by mouth daily.    Marland Kitchen levothyroxine (SYNTHROID, LEVOTHROID) 75 MCG tablet Take 88 mcg by mouth daily before breakfast.     . Multiple Vitamin (MULTIVITAMIN) capsule Take 1 capsule by mouth daily.    . vitamin B-12 (CYANOCOBALAMIN) 500 MCG tablet Take 500 mcg by mouth daily.     No current facility-administered  medications on file prior to visit.    Objective:  Vitals:   11/01/19 1055  BP: 126/80  Pulse: 71              Physical examination General NAD, Conversant  HEENT Atraumatic; Op clear with mmm.  Normo-cephalic. Pupils reactive. Anicteric sclerae  Thyroid/Neck Smooth without nodularity or enlargement. Normal ROM.  Neck Supple.  Skin  erythematous rash in right axilla lesions or ulceration. Normal palpated skin turgor. No nodularity.  Breasts: No masses or discharge.  Symmetric.  No axillary adenopathy.  Status post augmentation surgery  Lungs: Clear to auscultation.No rales or wheezes. Normal Respiratory effort, no retractions.  Heart: NSR.  No murmurs or rubs appreciated. No periferal edema  Abdomen: Soft.  Non-tender.  No masses.  No HSM. No hernia  Extremities: Moves all appropriately.  Normal ROM for age. No lymphadenopathy.  Neuro: Oriented to PPT.  Normal mood. Normal affect.     Pelvic:   Vulva: Normal appearance.  No lesions.   Vagina: No lesions or abnormalities noted.  Support:  Third-degree vaginal cuff prolapse  Urethra No masses tenderness or scarring.  Meatus Normal size without lesions or prolapse.  Cervix: Surgically absent   Anus: Normal exam.  No lesions.  Perineum: Normal exam.  No lesions.        Bimanual   Uterus: Surgically absent   Adnexae: No masses.  Non-tender to palpation.  Cul-de-sac: Negative for abnormality.     Assessment:    [id number] Patient Active Problem List   Diagnosis Date Noted  . Special screening for malignant neoplasms, colon   . Chronic constipation 10/03/2014  . Post-menopause on HRT (hormone replacement therapy) 10/03/2014  . S/P TAH-BSO (total abdominal hysterectomy and bilateral salpingo-oophorectomy) 10/03/2014  . Bariatric surgery status 10/03/2014  . Iron deficiency anemia due to dietary causes 10/03/2014     1. Encounter for screening mammogram for malignant neoplasm of breast   2. Postmenopausal hormone therapy    3. Moniliasis, cutaneous   4. Vaginal vault prolapse after hysterectomy     Axillary rash consistent with monilia   Plan:            1.  Basic Screening Recommendations The basic screening recommendations for asymptomatic women were discussed with the patient during her visit.  The age-appropriate recommendations were discussed with her and the rational for the tests reviewed.  When I am informed by the patient that another primary care physician has previously obtained the age-appropriate tests and they are up-to-date, only outstanding tests are ordered and referrals given as necessary.  Abnormal results of tests will be discussed with her when all of her results are completed.  Routine preventative health maintenance measures emphasized: Exercise/Diet/Weight control, Tobacco Warnings, Alcohol/Substance use risks and Stress Management Mammogram ordered 2.  Advised antifungal cream for axillary rash patient to inform us if this does not immediately improve 3.  Encouraged exercise and weight loss (patient with history of bariatric surgery) 4.  Continue calcium vitamin D TSH etc. 5.  She will inform us if her pelvic symptoms change.  Orders Orders Placed This Encounter  Procedures  . MM 3D SCREEN BREAST BILATERAL     Meds ordered this encounter  Medications  . estradiol (ESTRACE) 1 MG tablet    Sig: Take 1 tablet (1 mg total) by mouth daily.    Dispense:  90 tablet    Refill:  4        F/U  No follow-ups on file.  Elonda Husky, M.D. 11/01/2019 11:38 AM

## 2019-12-13 ENCOUNTER — Other Ambulatory Visit: Payer: Self-pay

## 2019-12-13 ENCOUNTER — Ambulatory Visit
Admission: RE | Admit: 2019-12-13 | Discharge: 2019-12-13 | Disposition: A | Discharge status: Home / Self Care | Payer: Medicare HMO | Source: Ambulatory Visit | Attending: Obstetrics and Gynecology | Admitting: Obstetrics and Gynecology

## 2019-12-13 DIAGNOSIS — Z1231 Encounter for screening mammogram for malignant neoplasm of breast: Secondary | ICD-10-CM

## 2020-01-30 ENCOUNTER — Ambulatory Visit: Payer: Medicare HMO | Attending: Internal Medicine

## 2020-01-30 DIAGNOSIS — Z23 Encounter for immunization: Secondary | ICD-10-CM

## 2020-01-30 NOTE — Progress Notes (Signed)
   Cov[facility] Vaccination [facility]  Name:  CASSADY STANCZAK    MRN: [id number] DOB: November 29, 1950  01/30/2020  Ms. Ferraiolo was observed post Covid-19 immunization for 15 minutes without incident. She was provided with Vaccine Information Sheet and instruction to access the V-Safe system.   Ms. Melfi was instructed to call 911 with any severe reactions post vaccine: Marland Kitchen Difficulty breathing  . Swelling of face and throat  . A fast heartbeat  . A bad rash all over body  . Dizziness and weakness

## 2020-11-01 ENCOUNTER — Encounter: Payer: Medicare HMO | Admitting: Obstetrics and Gynecology

## 2020-11-02 ENCOUNTER — Encounter: Payer: Medicare HMO | Admitting: Obstetrics and Gynecology

## 2020-11-09 ENCOUNTER — Encounter: Payer: Self-pay | Admitting: Obstetrics and Gynecology

## 2020-12-05 ENCOUNTER — Other Ambulatory Visit: Payer: Self-pay

## 2020-12-05 ENCOUNTER — Encounter: Payer: Self-pay | Admitting: Obstetrics and Gynecology

## 2020-12-05 ENCOUNTER — Ambulatory Visit (INDEPENDENT_AMBULATORY_CARE_PROVIDER_SITE_OTHER): Payer: Medicare HMO | Admitting: Obstetrics and Gynecology

## 2020-12-05 DIAGNOSIS — Z79890 Hormone replacement therapy (postmenopausal): Secondary | ICD-10-CM

## 2020-12-05 DIAGNOSIS — N993 Prolapse of vaginal vault after hysterectomy: Secondary | ICD-10-CM | POA: Diagnosis not present

## 2020-12-05 DIAGNOSIS — Z01419 Encounter for gynecological examination (general) (routine) without abnormal findings: Secondary | ICD-10-CM | POA: Diagnosis not present

## 2020-12-05 MED ORDER — ESTRADIOL 1 MG PO TABS: 1.0000 mg | ORAL_TABLET | Freq: Every day | ORAL | 3 refills | Status: DC

## 2020-12-05 NOTE — Progress Notes (Signed)
HPI:      [name] is a 70 y.o. G2P2002 who LMP was No LMP recorded. Patient has had a hysterectomy.  Subjective:   She presents today for her annual examination.  She continues to use the Estrace daily.  She is not having any problems. She states that her known vaginal cuff prolapse " is no worse". She has a history of partial thyroidectomy partial parathyroidectomy.  Her endocrinology labs are normal. She has bilateral breast implants and is up-to-date on her mammography.    Hx: The following portions of the patient's history were reviewed and updated as appropriate:             She  has a past medical history of Blood transfusion without reported diagnosis, Iron deficiency anemia due to dietary causes (10/03/2014), Sleep apnea, and Thyroid disease. She does not have any pertinent problems on file. She  has a past surgical history that includes Total vaginal hysterectomy (12/2013); Bariatric Surgery (09/2012); Laparoscopic cholecystectomy (09/1996); lower body lift (12/2014); Brachioplasty (06/2015); breast lift (06/2015); Breast enhancement surgery (06/2015); Colonoscopy with propofol (N/A, 11/08/2015); Blepharoptosis repair; Breast cyst aspiration (Right); Thyroid lobectomy (Right); Parathyroidectomy (Left); and Augmentation mammaplasty (Bilateral, 09/2015). Her family history includes Arthritis in her maternal grandmother and mother; Cancer in her father, paternal grandfather, and paternal grandmother; Hyperlipidemia in her father and mother; Lung cancer in her sister; Stroke in her maternal grandmother and paternal grandfather; Vision loss in her father. She  reports that she has quit smoking. She has never used smokeless tobacco. She reports that she does not drink alcohol and does not use drugs. She has a current medication list which includes the following prescription(s): b complex vitamins, calcium carb-cholecalciferol, levothyroxine, multivitamin, vitamin b-12, and estradiol. She has No  Known Allergies.       Review of Systems:  Review of Systems  Constitutional: Denied constitutional symptoms, night sweats, recent illness, fatigue, fever, insomnia and weight loss.  Eyes: Denied eye symptoms, eye pain, photophobia, vision change and visual disturbance.  Ears/Nose/Throat/Neck: Denied ear, nose, throat or neck symptoms, hearing loss, nasal discharge, sinus congestion and sore throat.  Cardiovascular: Denied cardiovascular symptoms, arrhythmia, chest pain/pressure, edema, exercise intolerance, orthopnea and palpitations.  Respiratory: Denied pulmonary symptoms, asthma, pleuritic pain, productive sputum, cough, dyspnea and wheezing.  Gastrointestinal: Denied, gastro-esophageal reflux, melena, nausea and vomiting.  Genitourinary: Denied genitourinary symptoms including symptomatic vaginal discharge, pelvic relaxation issues, and urinary complaints.  Musculoskeletal: Denied musculoskeletal symptoms, stiffness, swelling, muscle weakness and myalgia.  Dermatologic: Denied dermatology symptoms, rash and scar.  Neurologic: Denied neurology symptoms, dizziness, headache, neck pain and syncope.  Psychiatric: Denied psychiatric symptoms, anxiety and depression.  Endocrine: Denied endocrine symptoms including hot flashes and night sweats.   Meds:   Current Outpatient Medications on File Prior to Visit  Medication Sig Dispense Refill   b complex vitamins capsule Take 1 capsule by mouth daily.     Calcium Carb-Cholecalciferol (CALCIUM 1000 + D PO) Take 1,000 mg by mouth daily.     levothyroxine (SYNTHROID) 88 MCG tablet TAKE 1 TAB BY MOUTH DAILY ON AN EMTPY STOMACH WITH A GLASS OF WATER AT LEAST 30-60 MINUTES BEFORE BREAKFAST     Multiple Vitamin (MULTIVITAMIN) capsule Take 1 capsule by mouth daily.     vitamin B-12 (CYANOCOBALAMIN) 500 MCG tablet Take 500 mcg by mouth daily.     No current facility-administered medications on file prior to visit.       Objective:     There  were  no vitals filed for this visit.  There were no vitals filed for this visit.            Physical examination General NAD, Conversant  HEENT Atraumatic; Op clear with mmm.  Normo-cephalic. Pupils reactive. Anicteric sclerae  Thyroid/Neck Smooth without nodularity or enlargement. Normal ROM.  Neck Supple.  Skin No rashes, lesions or ulceration. Normal palpated skin turgor. No nodularity.  Breasts: No masses or discharge.  Symmetric.  No axillary adenopathy.  Bilateral implants  Lungs: Clear to auscultation.No rales or wheezes. Normal Respiratory effort, no retractions.  Heart: NSR.  No murmurs or rubs appreciated. No periferal edema  Abdomen: Soft.  Non-tender.  No masses.  No HSM. No hernia  Extremities: Moves all appropriately.  Normal ROM for age. No lymphadenopathy.  Neuro: Oriented to PPT.  Normal mood. Normal affect.     Pelvic:   Vulva: Normal appearance.  No lesions.   Vagina: No lesions or abnormalities noted.  Support: Fourth degree vaginal cuff prolapse -rectocele worse than cystocele  Urethra No masses tenderness or scarring.  Meatus Normal size without lesions or prolapse.  Cervix: Surgically absent   Anus: Normal exam.  No lesions.  Perineum: Normal exam.  No lesions.        Bimanual   Uterus: Surgically absent   Adnexae: No masses.  Non-tender to palpation.  Cul-de-sac: Negative for abnormality.     Assessment:    [id number] Patient Active Problem List   Diagnosis Date Noted   Special screening for malignant neoplasms, colon    Chronic constipation 10/03/2014   Post-menopause on HRT (hormone replacement therapy) 10/03/2014   S/P TAH-BSO (total abdominal hysterectomy and bilateral salpingo-oophorectomy) 10/03/2014   Bariatric surgery status 10/03/2014   Iron deficiency anemia due to dietary causes 10/03/2014     1. Well woman exam with routine gynecological exam   2. Postmenopausal hormone therapy   3. Prolapse of vaginal cuff after hysterectomy      Patient generally doing well-remains asymptomatic with vaginal cuff prolapse  ERT without issue.   Plan:            1.  Basic Screening Recommendations The basic screening recommendations for asymptomatic women were discussed with the patient during her visit.  The age-appropriate recommendations were discussed with her and the rational for the tests reviewed.  When I am informed by the patient that another primary care physician has previously obtained the age-appropriate tests and they are up-to-date, only outstanding tests are ordered and referrals given as necessary.  Abnormal results of tests will be discussed with her when all of her results are completed.  Routine preventative health maintenance measures emphasized: Exercise/Diet/Weight control, Tobacco Warnings, Alcohol/Substance use risks and Stress Management Mammogram ordered 2.  Continue ERT 3.  Vaginal cuff prolapse discussed.  Nothing further to do at this time as patient is asymptomatic. Orders Orders Placed This Encounter  Procedures   MM DIGITAL SCREENING BILATERAL     Meds ordered this encounter  Medications   estradiol (ESTRACE) 1 MG tablet    Sig: Take 1 tablet (1 mg total) by mouth daily.    Dispense:  90 tablet    Refill:  3            F/U  Return in about 1 year (around 12/05/2021) for Annual Physical.  Elonda Husky, M.D. 12/05/2020 11:35 AM

## 2020-12-14 ENCOUNTER — Other Ambulatory Visit: Payer: Self-pay

## 2020-12-14 ENCOUNTER — Ambulatory Visit
Admission: RE | Admit: 2020-12-14 | Discharge: 2020-12-14 | Disposition: A | Discharge status: Home / Self Care | Payer: Medicare HMO | Source: Ambulatory Visit | Attending: Obstetrics and Gynecology | Admitting: Obstetrics and Gynecology

## 2020-12-14 DIAGNOSIS — Z1231 Encounter for screening mammogram for malignant neoplasm of breast: Secondary | ICD-10-CM | POA: Diagnosis present

## 2020-12-14 DIAGNOSIS — Z01419 Encounter for gynecological examination (general) (routine) without abnormal findings: Secondary | ICD-10-CM | POA: Insufficient documentation

## 2021-12-05 ENCOUNTER — Other Ambulatory Visit: Payer: Self-pay | Admitting: Obstetrics and Gynecology

## 2021-12-05 DIAGNOSIS — Z79890 Hormone replacement therapy (postmenopausal): Secondary | ICD-10-CM

## 2021-12-10 ENCOUNTER — Ambulatory Visit (INDEPENDENT_AMBULATORY_CARE_PROVIDER_SITE_OTHER): Payer: Medicare HMO | Admitting: Obstetrics and Gynecology

## 2021-12-10 ENCOUNTER — Other Ambulatory Visit: Payer: Self-pay

## 2021-12-10 ENCOUNTER — Encounter: Payer: Self-pay | Admitting: Obstetrics and Gynecology

## 2021-12-10 VITALS — BP 135/82 | HR 71 | Ht 67.0 in | Wt 212.8 lb

## 2021-12-10 DIAGNOSIS — Z1231 Encounter for screening mammogram for malignant neoplasm of breast: Secondary | ICD-10-CM | POA: Diagnosis not present

## 2021-12-10 DIAGNOSIS — Z01419 Encounter for gynecological examination (general) (routine) without abnormal findings: Secondary | ICD-10-CM | POA: Diagnosis not present

## 2021-12-10 DIAGNOSIS — R159 Full incontinence of feces: Secondary | ICD-10-CM | POA: Diagnosis not present

## 2021-12-10 DIAGNOSIS — Z79890 Hormone replacement therapy (postmenopausal): Secondary | ICD-10-CM | POA: Diagnosis not present

## 2021-12-10 NOTE — Progress Notes (Signed)
HPI:      Tracey Jensen is a 71 y.o. [phone] who LMP was No LMP recorded. Patient has had a hysterectomy.  Subjective:   She presents today for her annual examination.  She states that she is generally doing well.  Having occasional hot flashes but taking her estrogen daily. She does state that she has had some worsening symptoms of anal incontinence if she does not immediately go to the bathroom when she has the urge.  This is begun to get annoying for her. She has had a history of vaginal cuff prolapse but reports few vaginal symptoms or issues with this.    Hx: The following portions of the patient's history were reviewed and updated as appropriate:             She  has a past medical history of Blood transfusion without reported diagnosis, Iron deficiency anemia due to dietary causes (10/03/2014), Sleep apnea, and Thyroid disease. She does not have any pertinent problems on file. She  has a past surgical history that includes Total vaginal hysterectomy (12/2013); Bariatric Surgery (09/2012); Laparoscopic cholecystectomy (09/1996); lower body lift (12/2014); Brachioplasty (06/2015); breast lift (06/2015); Breast enhancement surgery (06/2015); Colonoscopy with propofol (N/A, 11/08/2015); Blepharoptosis repair; Breast cyst aspiration (Right); Thyroid lobectomy (Right); Parathyroidectomy (Left); and Augmentation mammaplasty (Bilateral, 09/2015). Her family history includes Arthritis in her maternal grandmother and mother; Cancer in her father, paternal grandfather, and paternal grandmother; Hyperlipidemia in her father and mother; Lung cancer in her sister; Stroke in her maternal grandmother and paternal grandfather; Vision loss in her father. She  reports that she has quit smoking. She has never used smokeless tobacco. She reports current alcohol use. She reports that she does not use drugs. She has a current medication list which includes the following prescription(s): calcium carb-cholecalciferol,  levothyroxine, multivitamin, and estradiol. She has No Known Allergies.       Review of Systems:  Review of Systems  Constitutional: Denied constitutional symptoms, night sweats, recent illness, fatigue, fever, insomnia and weight loss.  Eyes: Denied eye symptoms, eye pain, photophobia, vision change and visual disturbance.  Ears/Nose/Throat/Neck: Denied ear, nose, throat or neck symptoms, hearing loss, nasal discharge, sinus congestion and sore throat.  Cardiovascular: Denied cardiovascular symptoms, arrhythmia, chest pain/pressure, edema, exercise intolerance, orthopnea and palpitations.  Respiratory: Denied pulmonary symptoms, asthma, pleuritic pain, productive sputum, cough, dyspnea and wheezing.  Gastrointestinal: Denied, gastro-esophageal reflux, melena, nausea and vomiting.  Genitourinary: See HPI for additional information.  Musculoskeletal: Denied musculoskeletal symptoms, stiffness, swelling, muscle weakness and myalgia.  Dermatologic: Denied dermatology symptoms, rash and scar.  Neurologic: Denied neurology symptoms, dizziness, headache, neck pain and syncope.  Psychiatric: Denied psychiatric symptoms, anxiety and depression.  Endocrine: Denied endocrine symptoms including hot flashes and night sweats.   Meds:   Current Outpatient Medications on File Prior to Visit  Medication Sig Dispense Refill   Calcium Carb-Cholecalciferol (CALCIUM 1000 + D PO) Take 1,000 mg by mouth daily.     levothyroxine (SYNTHROID) 88 MCG tablet TAKE 1 TAB BY MOUTH DAILY ON AN EMTPY STOMACH WITH A GLASS OF WATER AT LEAST 30-60 MINUTES BEFORE BREAKFAST     Multiple Vitamin (MULTIVITAMIN) capsule Take 1 capsule by mouth daily.     estradiol (ESTRACE) 1 MG tablet TAKE 1 TABLET BY MOUTH EVERY DAY 90 tablet 3   No current facility-administered medications on file prior to visit.     Objective:     Vitals:   12/10/21 1102  BP: 135/82  Pulse: 71  Filed Weights   12/10/21 1102  Weight: 212 lb  12.8 oz (96.5 kg)             Physical examination General NAD, Conversant  HEENT Atraumatic; Op clear with mmm.  Normo-cephalic. Pupils reactive. Anicteric sclerae  Thyroid/Neck Smooth without nodularity or enlargement. Normal ROM.  Neck Supple.  Skin No rashes, lesions or ulceration. Normal palpated skin turgor. No nodularity.  Breasts: No masses or discharge.  Symmetric.  No axillary adenopathy.  Lungs: Clear to auscultation.No rales or wheezes. Normal Respiratory effort, no retractions.  Heart: NSR.  No murmurs or rubs appreciated. No periferal edema  Abdomen: Soft.  Non-tender.  No masses.  No HSM. No hernia  Extremities: Moves all appropriately.  Normal ROM for age. No lymphadenopathy.  Neuro: Oriented to PPT.  Normal mood. Normal affect.     Pelvic:   Vulva: Normal appearance.  No lesions.   Vagina: No lesions or abnormalities noted.  Support: Fourth degree vaginal cuff prolapse with associated cystocele rectocele.  Urethra No masses tenderness or scarring.  Meatus Normal size without lesions or prolapse.  Cervix: Surgically absent   Anus: Normal exam.  No lesions.  Perineum: Normal exam.  No lesions.        Bimanual   Uterus: Surgically absent   Adnexae: No masses.  Non-tender to palpation.  Cul-de-sac: Negative for abnormality.      Assessment:    [id number] Patient Active Problem List   Diagnosis Date Noted   Special screening for malignant neoplasms, colon    Chronic constipation 10/03/2014   Post-menopause on HRT (hormone replacement therapy) 10/03/2014   S/P TAH-BSO (total abdominal hysterectomy and bilateral salpingo-oophorectomy) 10/03/2014   Bariatric surgery status 10/03/2014   Iron deficiency anemia due to dietary causes 10/03/2014     1. Well woman exam with routine gynecological exam   2. Screening mammogram for breast cancer   3. Postmenopausal hormone therapy   4. Anal sphincter incontinence     We discussed her vaginal vault prolapse.  This does  not seem to be an issue for her except where is possibly contributing to her anal incontinence.  We have also discussed anal incontinence and surgical correction by colorectal surgery.     Plan:            1.  Basic Screening Recommendations The basic screening recommendations for asymptomatic women were discussed with the patient during her visit.  The age-appropriate recommendations were discussed with her and the rational for the tests reviewed.  When I am informed by the patient that another primary care physician has previously obtained the age-appropriate tests and they are up-to-date, only outstanding tests are ordered and referrals given as necessary.  Abnormal results of tests will be discussed with her when all of her results are completed.  Routine preventative health maintenance measures emphasized: Exercise/Diet/Weight control, Tobacco Warnings, Alcohol/Substance use risks and Stress Management Mammogram ordered  2.  Patient will use Citrucel to keep her stool soft and see if this has any effect on her anal incontinence issues by keeping formed stool out of the rectum and possibly reducing her immediate urge.  Should she fail this consider surgical referral to colorectal surgery.  3.  As she is asymptomatic with her vaginal cuff prolapse nothing further to do at this time. Orders Orders Placed This Encounter  Procedures   MM DIGITAL SCREENING BILATERAL    No orders of the defined types were placed in this encounter.  F/U  Return in about 1 year (around 12/11/2022) for Annual Physical.  Elonda Husky, M.D. 12/10/2021 11:35 AM

## 2021-12-10 NOTE — Progress Notes (Signed)
Patients presents for annual exam today. She states doing well, continuing daily estradiol. Patient is due for mammogram, ordered. Annual labs deferred to PCP. Patient states no other questions or concerns at this time.

## 2022-01-06 ENCOUNTER — Ambulatory Visit
Admission: RE | Admit: 2022-01-06 | Discharge: 2022-01-06 | Disposition: A | Discharge status: Home / Self Care | Payer: Medicare HMO | Source: Ambulatory Visit | Attending: Obstetrics and Gynecology | Admitting: Obstetrics and Gynecology

## 2022-01-06 DIAGNOSIS — Z1231 Encounter for screening mammogram for malignant neoplasm of breast: Secondary | ICD-10-CM | POA: Insufficient documentation

## 2022-01-06 DIAGNOSIS — Z01419 Encounter for gynecological examination (general) (routine) without abnormal findings: Secondary | ICD-10-CM | POA: Insufficient documentation

## 2022-01-06 DIAGNOSIS — R922 Inconclusive mammogram: Secondary | ICD-10-CM | POA: Insufficient documentation

## 2022-11-30 ENCOUNTER — Other Ambulatory Visit: Payer: Self-pay | Admitting: Obstetrics and Gynecology

## 2022-11-30 DIAGNOSIS — Z79890 Hormone replacement therapy (postmenopausal): Secondary | ICD-10-CM

## 2023-03-18 ENCOUNTER — Encounter: Payer: Self-pay | Admitting: Ophthalmology

## 2023-03-20 ENCOUNTER — Encounter: Payer: Self-pay | Admitting: Ophthalmology

## 2023-03-20 NOTE — Anesthesia Preprocedure Evaluation (Signed)
Anesthesia Evaluation  Patient identified by MRN, date of birth, ID band Patient awake    Reviewed: Allergy & Precautions, H&P , NPO status , Patient's Chart, lab work & pertinent test results  History of Anesthesia Complications (+) PONV and history of anesthetic complications  Airway Mallampati: II  TM Distance: >3 FB Neck ROM: Full    Dental no notable dental hx. (+) Caps   Pulmonary neg pulmonary ROS, former smoker   Pulmonary exam normal breath sounds clear to auscultation       Cardiovascular negative cardio ROS Normal cardiovascular exam Rhythm:Regular Rate:Normal     Neuro/Psych negative neurological ROS  negative psych ROS   GI/Hepatic negative GI ROS, Neg liver ROS,,,  Endo/Other  negative endocrine ROS    Renal/GU negative Renal ROS  negative genitourinary   Musculoskeletal negative musculoskeletal ROS (+)    Abdominal   Peds negative pediatric ROS (+)  Hematology negative hematology ROS (+)   Anesthesia Other Findings Medical History  Blood transfusion without reported diagnosis  Sleep apnea Iron deficiency anemia due to dietary causes  Thyroid disease PONV (postoperative nausea and vomiting)  Wears hearing aid in both ears    Reproductive/Obstetrics negative OB ROS                              Anesthesia Physical Anesthesia Plan  ASA: 3  Anesthesia Plan: MAC   Post-op Pain Management:    Induction: Intravenous  PONV Risk Score and Plan:   Airway Management Planned: Natural Airway and Nasal Cannula  Additional Equipment:   Intra-op Plan:   Post-operative Plan:   Informed Consent: I have reviewed the patients History and Physical, chart, labs and discussed the procedure including the risks, benefits and alternatives for the proposed anesthesia with the patient or authorized representative who has indicated his/her understanding and acceptance.      Dental Advisory Given  Plan Discussed with: Anesthesiologist, CRNA and Surgeon  Anesthesia Plan Comments: (Patient consented for risks of anesthesia including but not limited to:  - adverse reactions to medications - damage to eyes, teeth, lips or other oral mucosa - nerve damage due to positioning  - sore throat or hoarseness - Damage to heart, brain, nerves, lungs, other parts of body or loss of life  Patient voiced understanding and assent.)         Anesthesia Quick Evaluation

## 2023-03-20 NOTE — Discharge Instructions (Signed)

## 2023-03-23 ENCOUNTER — Ambulatory Visit: Payer: Medicare HMO | Admitting: Anesthesiology

## 2023-03-23 ENCOUNTER — Ambulatory Visit
Admission: RE | Admit: 2023-03-23 | Discharge: 2023-03-23 | Disposition: A | Discharge status: Home / Self Care | Payer: Medicare HMO | Attending: Ophthalmology | Admitting: Ophthalmology

## 2023-03-23 ENCOUNTER — Encounter
Admission: RE | Disposition: A | Discharge status: Home / Self Care | Payer: Self-pay | Source: Home / Self Care | Attending: Ophthalmology

## 2023-03-23 ENCOUNTER — Other Ambulatory Visit: Payer: Self-pay

## 2023-03-23 ENCOUNTER — Encounter: Payer: Self-pay | Admitting: Ophthalmology

## 2023-03-23 DIAGNOSIS — Z87891 Personal history of nicotine dependence: Secondary | ICD-10-CM | POA: Diagnosis not present

## 2023-03-23 DIAGNOSIS — H2512 Age-related nuclear cataract, left eye: Secondary | ICD-10-CM | POA: Insufficient documentation

## 2023-03-23 HISTORY — PX: CATARACT EXTRACTION W/PHACO: SHX586

## 2023-03-23 HISTORY — DX: Nausea with vomiting, unspecified: R11.2

## 2023-03-23 HISTORY — DX: Postprocedural hypothyroidism: E89.0

## 2023-03-23 HISTORY — DX: Presence of external hearing-aid: Z97.4

## 2023-03-23 HISTORY — DX: Other specified postprocedural states: Z98.890

## 2023-03-23 HISTORY — DX: Personal history of malignant neoplasm of thyroid: Z85.850

## 2023-03-23 SURGERY — PHACOEMULSIFICATION, CATARACT, WITH IOL INSERTION
Anesthesia: Monitor Anesthesia Care | Site: Eye | Laterality: Left

## 2023-03-23 MED ORDER — TETRACAINE HCL 0.5 % OP SOLN
OPHTHALMIC | Status: AC
  Filled 2023-03-23: qty 4

## 2023-03-23 MED ORDER — SIGHTPATH DOSE#1 BSS IO SOLN
INTRAOCULAR | Status: DC | PRN
  Administered 2023-03-23: 15 mL

## 2023-03-23 MED ORDER — MIDAZOLAM HCL 2 MG/2ML IJ SOLN
INTRAMUSCULAR | Status: AC
Start: 2023-03-23 — End: ?
  Filled 2023-03-23: qty 2

## 2023-03-23 MED ORDER — MOXIFLOXACIN HCL 0.5 % OP SOLN
OPHTHALMIC | Status: DC | PRN
  Administered 2023-03-23: .2 mL via OPHTHALMIC

## 2023-03-23 MED ORDER — SIGHTPATH DOSE#1 BSS IO SOLN
INTRAOCULAR | Status: DC | PRN
  Administered 2023-03-23: 95 mL via OPHTHALMIC

## 2023-03-23 MED ORDER — FENTANYL CITRATE (PF) 100 MCG/2ML IJ SOLN
INTRAMUSCULAR | Status: AC
  Filled 2023-03-23: qty 2

## 2023-03-23 MED ORDER — LIDOCAINE HCL (PF) 2 % IJ SOLN
INTRAOCULAR | Status: DC | PRN
  Administered 2023-03-23: 1 mL via INTRAOCULAR

## 2023-03-23 MED ORDER — ARMC OPHTHALMIC DILATING DROPS
OPHTHALMIC | Status: AC
  Filled 2023-03-23: qty 0.5

## 2023-03-23 MED ORDER — TETRACAINE HCL 0.5 % OP SOLN
1.0000 [drp] | OPHTHALMIC | Status: DC | PRN
  Administered 2023-03-23 (×3): 1 [drp] via OPHTHALMIC

## 2023-03-23 MED ORDER — MIDAZOLAM HCL 2 MG/2ML IJ SOLN
INTRAMUSCULAR | Status: DC | PRN
  Administered 2023-03-23: 2 mg via INTRAVENOUS

## 2023-03-23 MED ORDER — SIGHTPATH DOSE#1 NA HYALUR & NA CHOND-NA HYALUR IO KIT
PACK | INTRAOCULAR | Status: DC | PRN
  Administered 2023-03-23: 1 via OPHTHALMIC

## 2023-03-23 MED ORDER — FENTANYL CITRATE (PF) 100 MCG/2ML IJ SOLN
INTRAMUSCULAR | Status: DC | PRN
  Administered 2023-03-23: 25 ug via INTRAVENOUS

## 2023-03-23 MED ORDER — ARMC OPHTHALMIC DILATING DROPS
1.0000 | OPHTHALMIC | Status: DC | PRN
  Administered 2023-03-23 (×3): 1 via OPHTHALMIC

## 2023-03-23 SURGICAL SUPPLY — 11 items
CATARACT SUITE SIGHTPATH (MISCELLANEOUS) ×1
DISSECTOR HYDRO NUCLEUS 50X22 (MISCELLANEOUS) ×1 IMPLANT
FEE CATARACT SUITE SIGHTPATH (MISCELLANEOUS) ×1 IMPLANT
GLOVE PI ULTRA LF STRL 7.5 (GLOVE) ×1 IMPLANT
GLOVE SURG POLYISOPRENE 8.5 (GLOVE) ×1
GLOVE SURG SYN 8.5 PF PI BL (GLOVE) ×1 IMPLANT
LENS IOL TECNIS EYHANCE 15.0 (Intraocular Lens) IMPLANT
NDL FILTER BLUNT 18X1 1/2 (NEEDLE) ×1 IMPLANT
NEEDLE FILTER BLUNT 18X1 1/2 (NEEDLE) ×1
SYR 3ML LL SCALE MARK (SYRINGE) ×1 IMPLANT
SYR 5ML LL (SYRINGE) ×1 IMPLANT

## 2023-03-23 NOTE — Anesthesia Postprocedure Evaluation (Signed)
Anesthesia Post Note  Patient: Tracey Jensen  Procedure(s) Performed: CATARACT EXTRACTION PHACO AND INTRAOCULAR LENS PLACEMENT (IOC) LEFT  3.03  00:32.7 (Left: Eye)  Patient location during evaluation: PACU Anesthesia Type: MAC Level of consciousness: awake and alert Pain management: pain level controlled Vital Signs Assessment: post-procedure vital signs reviewed and stable Respiratory status: spontaneous breathing, nonlabored ventilation, respiratory function stable and patient connected to nasal cannula oxygen Cardiovascular status: stable and blood pressure returned to baseline Postop Assessment: no apparent nausea or vomiting Anesthetic complications: no   No notable events documented.   Last Vitals:  Vitals:   03/23/23 0854 03/23/23 0859  BP: 126/77 128/69  Pulse: 65 63  Resp: 10 14  Temp: (!) 36.2 C (!) 36.2 C  SpO2: 99% 98%    Last Pain:  Vitals:   03/23/23 0859  TempSrc:   PainSc: 0-No pain                 Bevely Palmer Levonia Wolfley

## 2023-03-23 NOTE — Op Note (Signed)
OPERATIVE NOTE  Tracey Jensen [id number] 03/23/2023   PREOPERATIVE DIAGNOSIS:  Nuclear sclerotic cataract left eye.  H25.12   POSTOPERATIVE DIAGNOSIS:    Nuclear sclerotic cataract left eye.     PROCEDURE:  Phacoemusification with posterior chamber intraocular lens placement of the left eye   LENS:   Implant Name Type Inv. Item Serial No. Manufacturer Lot No. LRB No. Used Action  LENS IOL TECNIS EYHANCE 15.0 - [id number] Intraocular Lens LENS IOL TECNIS EYHANCE 15.0 [id number] SIGHTPATH  Left 1 Implanted      Procedure(s): CATARACT EXTRACTION PHACO AND INTRAOCULAR LENS PLACEMENT (IOC) LEFT  3.03  00:32.7 (Left)  DIB00 +15.0   SURGEON:  Willey Blade, MD, MPH   ANESTHESIA:  Topical with tetracaine drops augmented with 1% preservative-free intracameral lidocaine.  ESTIMATED BLOOD LOSS: <1 mL   COMPLICATIONS:  None.   DESCRIPTION OF PROCEDURE:  The patient was identified in the holding room and transported to the operating room and placed in the supine position under the operating microscope.  The left eye was identified as the operative eye and it was prepped and draped in the usual sterile ophthalmic fashion.   A 1.0 millimeter clear-corneal paracentesis was made at the 5:00 position. 0.5 ml of preservative-free 1% lidocaine with epinephrine was injected into the anterior chamber.  The anterior chamber was filled with viscoelastic.  A 2.4 millimeter keratome was used to make a near-clear corneal incision at the 2:00 position.  A curvilinear capsulorrhexis was made with a cystotome and capsulorrhexis forceps.  Balanced salt solution was used to hydrodissect and hydrodelineate the nucleus.   Phacoemulsification was then used in stop and chop fashion to remove the lens nucleus and epinucleus.  The remaining cortex was then removed using the irrigation and aspiration handpiece. Viscoelastic was then placed into the capsular bag to distend it for lens placement.  A lens was then injected  into the capsular bag.  The remaining viscoelastic was aspirated.   Wounds were hydrated with balanced salt solution.  The anterior chamber was inflated to a physiologic pressure with balanced salt solution.  Intracameral vigamox 0.1 mL undiltued was injected into the eye and a drop placed onto the ocular surface.  No wound leaks were noted.  The patient was taken to the recovery room in stable condition without complications of anesthesia or surgery  Willey Blade 03/23/2023, 8:53 AM

## 2023-03-23 NOTE — H&P (Signed)
[facility]   Primary Care Physician:  Orene Desanctis, MD Ophthalmologist: Dr. Willey Blade  Pre-Procedure History & Physical: HPI:  Tracey Jensen is a 72 y.o. female here for cataract surgery.   Past Medical History:  Diagnosis Date   Blood transfusion without reported diagnosis    History of thyroid cancer    Iron deficiency anemia due to dietary causes 10/03/2014   PONV (postoperative nausea and vomiting)    nausea - many years ago   Postoperative hypothyroidism    Sleep apnea    history of prior to surgery   Thyroid disease    Wears hearing aid in both ears     Past Surgical History:  Procedure Laterality Date   AUGMENTATION MAMMAPLASTY Bilateral 09/2015   mastopexy and silicone implants   BARIATRIC SURGERY  09/2012   BELPHAROPTOSIS REPAIR     BRACHIOPLASTY  06/2015   BREAST CYST ASPIRATION Right    BREAST ENHANCEMENT SURGERY  06/2015   breast lift  06/2015   COLONOSCOPY WITH PROPOFOL N/A 11/08/2015   Procedure: COLONOSCOPY WITH PROPOFOL;  Surgeon: Midge Minium, MD;  Location: [facility];  Service: Endoscopy;  Laterality: N/A;   LAPAROSCOPIC CHOLECYSTECTOMY  09/1996   lower body lift  12/2014   PARATHYROIDECTOMY Left    THYROID LOBECTOMY Right    TOTAL VAGINAL HYSTERECTOMY  12/2013    Prior to Admission medications   Medication Sig Start Date End Date Taking? Authorizing Provider  atorvastatin (LIPITOR) 10 MG tablet Take 10 mg by mouth daily.   Yes [provider]  Calcium Carb-Cholecalciferol (CALCIUM 1000 + D PO) Take 1,000 mg by mouth daily.   Yes [provider]  cholecalciferol (VITAMIN D3) 25 MCG (1000 UNIT) tablet Take 1,000 Units by mouth daily.   Yes [provider]  estradiol (ESTRACE) 1 MG tablet TAKE 1 TABLET BY MOUTH EVERY DAY 12/10/21  Yes Linzie Collin, MD  levothyroxine (SYNTHROID) 100 MCG tablet Take 100 mcg by mouth daily before breakfast.   Yes [provider]  Multiple Vitamin (MULTIVITAMIN)  capsule Take 1 capsule by mouth daily.   Yes [provider]  vitamin B-12 (CYANOCOBALAMIN) 500 MCG tablet Take 500 mcg by mouth every other day.   Yes [provider]    Allergies as of 03/02/2023   (No Known Allergies)    Family History  Problem Relation Age of Onset   Arthritis Mother    Hyperlipidemia Mother    Cancer Father    Hyperlipidemia Father    Vision loss Father    Cancer Paternal Grandmother    Cancer Paternal Grandfather    Stroke Paternal Grandfather    Arthritis Maternal Grandmother    Stroke Maternal Grandmother    Lung cancer Sister    Breast cancer Neg Hx    Diabetes Neg Hx     Social History   Socioeconomic History   Marital status: Married    Spouse name: Not on file   Number of children: 2   Years of education: Not on file   Highest education level: Not on file  Occupational History   Occupation: retired    Associate Professor: [employer]  Tobacco Use   Smoking status: Former    Current packs/day: 0.00    Average packs/day: 0.3 packs/day for 15.0 years (3.8 ttl pk-yrs)    Types: Cigarettes    Start date: 61    Quit date: 1986    Years since quitting: 38.9   Smokeless tobacco: Never  Tobacco comments:    quit 35+ yrs ago  Vaping Use   Vaping status: Never Used  Substance and Sexual Activity   Alcohol use: Yes    Comment: socially   Drug use: No   Sexual activity: Yes    Partners: Male    Birth control/protection: Surgical  Other Topics Concern   Not on file  Social History Narrative   Not on file   Social Determinants of Health   Financial Resource Strain: Low Risk  (02/19/2023)   Received from [facility]   Overall Financial Resource Strain (CARDIA)    Difficulty of Paying Living Expenses: Not hard at all  Food Insecurity: No Food Insecurity (02/19/2023)   Received from [facility]   Hunger Vital Sign    Worried About Running Out of Food in the Last Year: Never true    Ran Out of Food  in the Last Year: Never true  Transportation Needs: No Transportation Needs (02/19/2023)   Received from [facility]    In the past 12 months, has lack of transportation kept you from medical appointments or from getting medications?: No    Lack of Transportation (Non-Medical): No  Physical Activity: Insufficiently Active (04/08/2021)   Received from [facility]   Exercise Vital Sign    Days of Exercise per Week: 3 days    Minutes of Exercise per Session: 40 min  Stress: No Stress Concern Present (04/08/2021)   Received from [facility] - Occupational Stress Questionnaire    Feeling of Stress : Only a little  Social Connections: Not on file  Intimate Partner Violence: Not on file    Review of Systems: See HPI, otherwise negative ROS  Physical Exam: BP (!) 148/85   Pulse 72   Temp 98 F (36.7 C) (Temporal)   Resp 18   Ht 5\' 7"  (1.702 m)   Wt 102.1 kg   SpO2 97%   BMI 35.24 kg/m  General:   Alert, cooperative in NAD Head:  Normocephalic and atraumatic. Respiratory:  Normal work of breathing. Cardiovascular:  RRR  Impression/Plan: Tracey Jensen is here for cataract surgery.  Risks, benefits, limitations, and alternatives regarding cataract surgery have been reviewed with the patient.  Questions have been answered.  All parties agreeable.   Willey Blade, MD  03/23/2023, 8:30 AM

## 2023-03-23 NOTE — Transfer of Care (Signed)
Immediate Anesthesia Transfer of Care Note  Patient: Tracey Jensen  Procedure(s) Performed: CATARACT EXTRACTION PHACO AND INTRAOCULAR LENS PLACEMENT (IOC) LEFT  3.03  00:32.7 (Left: Eye)  Patient Location: PACU  Anesthesia Type: MAC  Level of Consciousness: awake, alert  and patient cooperative  Airway and Oxygen Therapy: Patient Spontanous Breathing and Patient connected to supplemental oxygen  Post-op Assessment: Post-op Vital signs reviewed, Patient's Cardiovascular Status Stable, Respiratory Function Stable, Patent Airway and No signs of Nausea or vomiting  Post-op Vital Signs: Reviewed and stable  Complications: No notable events documented.

## 2023-03-24 ENCOUNTER — Encounter: Payer: Self-pay | Admitting: Ophthalmology

## 2023-04-02 NOTE — Discharge Instructions (Signed)

## 2023-04-06 ENCOUNTER — Ambulatory Visit: Payer: Medicare HMO | Admitting: Anesthesiology

## 2023-04-06 ENCOUNTER — Other Ambulatory Visit: Payer: Self-pay

## 2023-04-06 ENCOUNTER — Encounter
Admission: RE | Disposition: A | Discharge status: Home / Self Care | Payer: Self-pay | Source: Home / Self Care | Attending: Ophthalmology

## 2023-04-06 ENCOUNTER — Encounter: Payer: Self-pay | Admitting: Ophthalmology

## 2023-04-06 ENCOUNTER — Ambulatory Visit
Admission: RE | Admit: 2023-04-06 | Discharge: 2023-04-06 | Disposition: A | Discharge status: Home / Self Care | Payer: Medicare HMO | Attending: Ophthalmology | Admitting: Ophthalmology

## 2023-04-06 DIAGNOSIS — Z87891 Personal history of nicotine dependence: Secondary | ICD-10-CM | POA: Insufficient documentation

## 2023-04-06 DIAGNOSIS — G4733 Obstructive sleep apnea (adult) (pediatric): Secondary | ICD-10-CM | POA: Insufficient documentation

## 2023-04-06 DIAGNOSIS — E890 Postprocedural hypothyroidism: Secondary | ICD-10-CM | POA: Diagnosis not present

## 2023-04-06 DIAGNOSIS — H2511 Age-related nuclear cataract, right eye: Secondary | ICD-10-CM | POA: Insufficient documentation

## 2023-04-06 DIAGNOSIS — Z79890 Hormone replacement therapy (postmenopausal): Secondary | ICD-10-CM | POA: Diagnosis not present

## 2023-04-06 HISTORY — PX: CATARACT EXTRACTION W/PHACO: SHX586

## 2023-04-06 SURGERY — PHACOEMULSIFICATION, CATARACT, WITH IOL INSERTION
Anesthesia: Monitor Anesthesia Care | Laterality: Right

## 2023-04-06 MED ORDER — TETRACAINE HCL 0.5 % OP SOLN
OPHTHALMIC | Status: AC
  Filled 2023-04-06: qty 4

## 2023-04-06 MED ORDER — FENTANYL CITRATE (PF) 100 MCG/2ML IJ SOLN
INTRAMUSCULAR | Status: DC | PRN
  Administered 2023-04-06: 100 ug via INTRAVENOUS

## 2023-04-06 MED ORDER — SIGHTPATH DOSE#1 NA HYALUR & NA CHOND-NA HYALUR IO KIT
PACK | INTRAOCULAR | Status: DC | PRN
  Administered 2023-04-06: 1 via OPHTHALMIC

## 2023-04-06 MED ORDER — MIDAZOLAM HCL 2 MG/2ML IJ SOLN
INTRAMUSCULAR | Status: AC
  Filled 2023-04-06: qty 2

## 2023-04-06 MED ORDER — SIGHTPATH DOSE#1 BSS IO SOLN
INTRAOCULAR | Status: DC | PRN
  Administered 2023-04-06: 15 mL via INTRAOCULAR

## 2023-04-06 MED ORDER — ARMC OPHTHALMIC DILATING DROPS
1.0000 | OPHTHALMIC | Status: DC | PRN
  Administered 2023-04-06 (×2): 1 via OPHTHALMIC

## 2023-04-06 MED ORDER — MIDAZOLAM HCL 2 MG/2ML IJ SOLN
INTRAMUSCULAR | Status: DC | PRN
  Administered 2023-04-06: 2 mg via INTRAVENOUS

## 2023-04-06 MED ORDER — LIDOCAINE HCL (PF) 2 % IJ SOLN
INTRAOCULAR | Status: DC | PRN
  Administered 2023-04-06: 1 mL via INTRAOCULAR

## 2023-04-06 MED ORDER — TETRACAINE HCL 0.5 % OP SOLN
1.0000 [drp] | OPHTHALMIC | Status: DC | PRN
  Administered 2023-04-06 (×3): 1 [drp] via OPHTHALMIC

## 2023-04-06 MED ORDER — SIGHTPATH DOSE#1 BSS IO SOLN
INTRAOCULAR | Status: DC | PRN
  Administered 2023-04-06: 111 mL via OPHTHALMIC

## 2023-04-06 MED ORDER — MOXIFLOXACIN HCL 0.5 % OP SOLN
OPHTHALMIC | Status: DC | PRN
  Administered 2023-04-06: .2 mL via OPHTHALMIC

## 2023-04-06 MED ORDER — FENTANYL CITRATE (PF) 100 MCG/2ML IJ SOLN
INTRAMUSCULAR | Status: AC
  Filled 2023-04-06: qty 2

## 2023-04-06 SURGICAL SUPPLY — 11 items
CATARACT SUITE SIGHTPATH (MISCELLANEOUS) ×1 IMPLANT
DISSECTOR HYDRO NUCLEUS 50X22 (MISCELLANEOUS) ×1 IMPLANT
FEE CATARACT SUITE SIGHTPATH (MISCELLANEOUS) ×1 IMPLANT
GLOVE PI ULTRA LF STRL 7.5 (GLOVE) ×1 IMPLANT
GLOVE SURG POLYISOPRENE 8.5 (GLOVE) ×1 IMPLANT
GLOVE SURG SYN 8.5 PF PI BL (GLOVE) ×1 IMPLANT
LENS IOL TECNIS EYHANCE 17.0 (Intraocular Lens) IMPLANT
NDL FILTER BLUNT 18X1 1/2 (NEEDLE) ×1 IMPLANT
NEEDLE FILTER BLUNT 18X1 1/2 (NEEDLE) ×1 IMPLANT
SYR 3ML LL SCALE MARK (SYRINGE) ×1 IMPLANT
SYR 5ML LL (SYRINGE) ×1 IMPLANT

## 2023-04-06 NOTE — H&P (Signed)
[facility]   Primary Care Physician:  Orene Desanctis, MD Ophthalmologist: Dr. Willey Blade  Pre-Procedure History & Physical: HPI:  Tracey Jensen is a 72 y.o. female here for cataract surgery.   Past Medical History:  Diagnosis Date   Blood transfusion without reported diagnosis    History of thyroid cancer    Iron deficiency anemia due to dietary causes 10/03/2014   PONV (postoperative nausea and vomiting)    nausea - many years ago   Postoperative hypothyroidism    Sleep apnea    history of prior to surgery   Thyroid disease    Wears hearing aid in both ears     Past Surgical History:  Procedure Laterality Date   AUGMENTATION MAMMAPLASTY Bilateral 09/2015   mastopexy and silicone implants   BARIATRIC SURGERY  09/2012   BELPHAROPTOSIS REPAIR     BRACHIOPLASTY  06/2015   BREAST CYST ASPIRATION Right    BREAST ENHANCEMENT SURGERY  06/2015   breast lift  06/2015   CATARACT EXTRACTION W/PHACO Left 03/23/2023   Procedure: CATARACT EXTRACTION PHACO AND INTRAOCULAR LENS PLACEMENT (IOC) LEFT  3.03  00:32.7;  Surgeon: Nevada Crane, MD;  Location: [facility];  Service: Ophthalmology;  Laterality: Left;   COLONOSCOPY WITH PROPOFOL N/A 11/08/2015   Procedure: COLONOSCOPY WITH PROPOFOL;  Surgeon: Midge Minium, MD;  Location: [facility];  Service: Endoscopy;  Laterality: N/A;   LAPAROSCOPIC CHOLECYSTECTOMY  09/1996   lower body lift  12/2014   PARATHYROIDECTOMY Left    THYROID LOBECTOMY Right    TOTAL VAGINAL HYSTERECTOMY  12/2013    Prior to Admission medications   Medication Sig Start Date End Date Taking? Authorizing Provider  atorvastatin (LIPITOR) 10 MG tablet Take 10 mg by mouth daily.   Yes [provider]  Calcium Carb-Cholecalciferol (CALCIUM 1000 + D PO) Take 1,000 mg by mouth daily.   Yes [provider]  cholecalciferol (VITAMIN D3) 25 MCG (1000 UNIT) tablet Take 1,000 Units by mouth daily.   Yes [provider]   estradiol (ESTRACE) 1 MG tablet TAKE 1 TABLET BY MOUTH EVERY DAY 12/10/21  Yes Linzie Collin, MD  levothyroxine (SYNTHROID) 100 MCG tablet Take 100 mcg by mouth daily before breakfast.   Yes [provider]  Multiple Vitamin (MULTIVITAMIN) capsule Take 1 capsule by mouth daily.   Yes [provider]  vitamin B-12 (CYANOCOBALAMIN) 500 MCG tablet Take 500 mcg by mouth every other day.   Yes [provider]    Allergies as of 03/02/2023   (No Known Allergies)    Family History  Problem Relation Age of Onset   Arthritis Mother    Hyperlipidemia Mother    Cancer Father    Hyperlipidemia Father    Vision loss Father    Cancer Paternal Grandmother    Cancer Paternal Grandfather    Stroke Paternal Grandfather    Arthritis Maternal Grandmother    Stroke Maternal Grandmother    Lung cancer Sister    Breast cancer Neg Hx    Diabetes Neg Hx     Social History   Socioeconomic History   Marital status: Married    Spouse name: Not on file   Number of children: 2   Years of education: Not on file   Highest education level: Not on file  Occupational History   Occupation: retired    Associate Professor: [employer]  Tobacco Use   Smoking status: Former    Current packs/day: 0.00  Average packs/day: 0.3 packs/day for 15.0 years (3.8 ttl pk-yrs)    Types: Cigarettes    Start date: 25    Quit date: 43    Years since quitting: 38.9   Smokeless tobacco: Never   Tobacco comments:    quit 35+ yrs ago  Vaping Use   Vaping status: Never Used  Substance and Sexual Activity   Alcohol use: Yes    Comment: socially   Drug use: No   Sexual activity: Yes    Partners: Male    Birth control/protection: Surgical  Other Topics Concern   Not on file  Social History Narrative   Not on file   Social Determinants of Health   Financial Resource Strain: Low Risk  (02/19/2023)   Received from [facility]   Overall Financial Resource Strain (CARDIA)     Difficulty of Paying Living Expenses: Not hard at all  Food Insecurity: No Food Insecurity (02/19/2023)   Received from [facility]   Hunger Vital Sign    Worried About Running Out of Food in the Last Year: Never true    Ran Out of Food in the Last Year: Never true  Transportation Needs: No Transportation Needs (02/19/2023)   Received from [facility] - Transportation    In the past 12 months, has lack of transportation kept you from medical appointments or from getting medications?: No    Lack of Transportation (Non-Medical): No  Physical Activity: Insufficiently Active (04/08/2021)   Received from [facility]   Exercise Vital Sign    Days of Exercise per Week: 3 days    Minutes of Exercise per Session: 40 min  Stress: No Stress Concern Present (04/08/2021)   Received from [facility] - Occupational Stress Questionnaire    Feeling of Stress : Only a little  Social Connections: Not on file  Intimate Partner Violence: Not on file    Review of Systems: See HPI, otherwise negative ROS  Physical Exam: BP (!) 155/77   Pulse 65   Temp 98.1 F (36.7 C) (Temporal)   Resp 18   Ht 5\' 7"  (1.702 m)   Wt 102.1 kg   SpO2 96%   BMI 35.25 kg/m  General:   Alert, cooperative in NAD Head:  Normocephalic and atraumatic. Respiratory:  Normal work of breathing. Cardiovascular:  RRR  Impression/Plan: Tracey Jensen is here for cataract surgery.  Risks, benefits, limitations, and alternatives regarding cataract surgery have been reviewed with the patient.  Questions have been answered.  All parties agreeable.   Willey Blade, MD  04/06/2023, 9:30 AM

## 2023-04-06 NOTE — Anesthesia Postprocedure Evaluation (Signed)
Anesthesia Post Note  Patient: JONIECE BOKELMAN  Procedure(s) Performed: CATARACT EXTRACTION PHACO AND INTRAOCULAR LENS PLACEMENT (IOC) RIGHT 3.44 00:34.9 (Right)  Patient location during evaluation: PACU Anesthesia Type: MAC Level of consciousness: awake and alert Pain management: pain level controlled Vital Signs Assessment: post-procedure vital signs reviewed and stable Respiratory status: spontaneous breathing, nonlabored ventilation, respiratory function stable and patient connected to nasal cannula oxygen Cardiovascular status: stable and blood pressure returned to baseline Postop Assessment: no apparent nausea or vomiting Anesthetic complications: no   No notable events documented.   Last Vitals:  Vitals:   04/06/23 1000 04/06/23 1004  BP: 132/73 131/66  Pulse: (!) 58 (!) 57  Resp: 15 12  Temp:  36.7 C  SpO2: 96% 97%    Last Pain:  Vitals:   04/06/23 1004  TempSrc:   PainSc: 0-No pain                 Makaiyah Schweiger C Quintrell Baze

## 2023-04-06 NOTE — Anesthesia Preprocedure Evaluation (Signed)
Anesthesia Evaluation  Patient identified by MRN, date of birth, ID band Patient awake    Reviewed: Allergy & Precautions, H&P , NPO status , Patient's Chart, lab work & pertinent test results  History of Anesthesia Complications (+) PONV and history of anesthetic complications  Airway Mallampati: II  TM Distance: >3 FB Neck ROM: Full    Dental no notable dental hx. (+) Caps   Pulmonary sleep apnea , former smoker   Pulmonary exam normal breath sounds clear to auscultation       Cardiovascular negative cardio ROS Normal cardiovascular exam Rhythm:Regular Rate:Normal     Neuro/Psych negative neurological ROS  negative psych ROS   GI/Hepatic negative GI ROS, Neg liver ROS,,,  Endo/Other  Hypothyroidism    Renal/GU negative Renal ROS  negative genitourinary   Musculoskeletal negative musculoskeletal ROS (+)    Abdominal   Peds negative pediatric ROS (+)  Hematology  (+) Blood dyscrasia, anemia   Anesthesia Other Findings Blood transfusion without reported diagnosis Sleep apnea Iron deficiency anemia due to dietary causes Thyroid disease PONV (postoperative nausea and vomiting) Wears hearing aid in both ears History of thyroid cancer Postoperative hypothyroidism  Previous cataract surgery 03-23-23   Reproductive/Obstetrics negative OB ROS                              Anesthesia Physical Anesthesia Plan  ASA: 3  Anesthesia Plan: MAC   Post-op Pain Management:    Induction: Intravenous  PONV Risk Score and Plan:   Airway Management Planned: Natural Airway and Nasal Cannula  Additional Equipment:   Intra-op Plan:   Post-operative Plan:   Informed Consent: I have reviewed the patients History and Physical, chart, labs and discussed the procedure including the risks, benefits and alternatives for the proposed anesthesia with the patient or authorized representative who has  indicated his/her understanding and acceptance.     Dental Advisory Given  Plan Discussed with: Anesthesiologist, CRNA and Surgeon  Anesthesia Plan Comments: (Patient consented for risks of anesthesia including but not limited to:  - adverse reactions to medications - damage to eyes, teeth, lips or other oral mucosa - nerve damage due to positioning  - sore throat or hoarseness - Damage to heart, brain, nerves, lungs, other parts of body or loss of life  Patient voiced understanding and assent.)         Anesthesia Quick Evaluation

## 2023-04-06 NOTE — Transfer of Care (Signed)
Immediate Anesthesia Transfer of Care Note  Patient: Tracey Jensen  Procedure(s) Performed: CATARACT EXTRACTION PHACO AND INTRAOCULAR LENS PLACEMENT (IOC) RIGHT 3.44 00:34.9 (Right)  Patient Location: PACU  Anesthesia Type: MAC  Level of Consciousness: awake, alert  and patient cooperative  Airway and Oxygen Therapy: Patient Spontanous Breathing and Patient connected to supplemental oxygen  Post-op Assessment: Post-op Vital signs reviewed, Patient's Cardiovascular Status Stable, Respiratory Function Stable, Patent Airway and No signs of Nausea or vomiting  Post-op Vital Signs: Reviewed and stable  Complications: No notable events documented.

## 2023-04-06 NOTE — Op Note (Signed)
OPERATIVE NOTE  KANYON ARBUTHNOT [id number] 04/06/2023   PREOPERATIVE DIAGNOSIS:  Nuclear sclerotic cataract right eye.  H25.11   POSTOPERATIVE DIAGNOSIS:    Nuclear sclerotic cataract right eye.     PROCEDURE:  Phacoemusification with posterior chamber intraocular lens placement of the right eye   LENS:   Implant Name Type Inv. Item Serial No. Manufacturer Lot No. LRB No. Used Action  LENS IOL TECNIS EYHANCE 17.0 - [id number] Intraocular Lens LENS IOL TECNIS EYHANCE 17.0 [id number] [employer]  Right 1 Implanted       Procedure(s): CATARACT EXTRACTION PHACO AND INTRAOCULAR LENS PLACEMENT (IOC) RIGHT 3.44 00:34.9 (Right)  SURGEON:  Willey Blade, MD, MPH  ANESTHESIOLOGIST: Anesthesiologist: Marisue Humble, MD CRNA: Domenic Moras, CRNA   ANESTHESIA:  Topical with tetracaine drops augmented with 1% preservative-free intracameral lidocaine.  ESTIMATED BLOOD LOSS: less than 1 mL.   COMPLICATIONS:  None.   DESCRIPTION OF PROCEDURE:  The patient was identified in the holding room and transported to the operating room and placed in the supine position under the operating microscope.  The right eye was identified as the operative eye and it was prepped and draped in the usual sterile ophthalmic fashion.   A 1.0 millimeter clear-corneal paracentesis was made at the 10:30 position. 0.5 ml of preservative-free 1% lidocaine with epinephrine was injected into the anterior chamber.  The anterior chamber was filled with viscoelastic.  A 2.4 millimeter keratome was used to make a near-clear corneal incision at the 8:00 position.  A curvilinear capsulorrhexis was made with a cystotome and capsulorrhexis forceps.  Balanced salt solution was used to hydrodissect and hydrodelineate the nucleus.   Phacoemulsification was then used in stop and chop fashion to remove the lens nucleus and epinucleus.  The remaining cortex was then removed using the irrigation and aspiration handpiece. Viscoelastic was then  placed into the capsular bag to distend it for lens placement.  A lens was then injected into the capsular bag.  The remaining viscoelastic was aspirated.   Wounds were hydrated with balanced salt solution.  The anterior chamber was inflated to a physiologic pressure with balanced salt solution.   Intracameral vigamox 0.1 mL undiluted was injected into the eye and a drop placed onto the ocular surface.  No wound leaks were noted.  The patient was taken to the recovery room in stable condition without complications of anesthesia or surgery  Willey Blade 04/06/2023, 9:58 AM

## 2023-04-07 ENCOUNTER — Encounter: Payer: Self-pay | Admitting: Ophthalmology

## 2023-04-09 ENCOUNTER — Encounter: Payer: Self-pay | Admitting: Obstetrics and Gynecology

## 2023-04-09 ENCOUNTER — Ambulatory Visit: Payer: Medicare HMO | Admitting: Obstetrics and Gynecology

## 2023-04-09 VITALS — BP 151/93 | HR 71 | Ht 67.0 in | Wt 229.8 lb

## 2023-04-09 DIAGNOSIS — Z01419 Encounter for gynecological examination (general) (routine) without abnormal findings: Secondary | ICD-10-CM | POA: Diagnosis not present

## 2023-04-09 DIAGNOSIS — Z1231 Encounter for screening mammogram for malignant neoplasm of breast: Secondary | ICD-10-CM

## 2023-04-09 DIAGNOSIS — Z79890 Hormone replacement therapy (postmenopausal): Secondary | ICD-10-CM

## 2023-04-09 MED ORDER — ESTRADIOL 1 MG PO TABS: 1.0000 mg | ORAL_TABLET | Freq: Every day | ORAL | 3 refills | Status: AC

## 2023-04-09 NOTE — Progress Notes (Signed)
HPI:      [name] is a 71 y.o. G2P2002 who LMP was No LMP recorded. Patient has had a hysterectomy.  Subjective:   She presents today for her annual examination.  She has no significant complaints.  She states that her pelvic prolapse is not causing any new problems for her.  She is not having pelvic pressure or pain regularly and is not regularly losing urine.  She does state that she has occasional urgency but she has lived with this for many years. Of significant note she has had a previous hysterectomy. She is currently taking estradiol and would like to continue.    Hx: The following portions of the patient's history were reviewed and updated as appropriate:             She  has a past medical history of Blood transfusion without reported diagnosis, History of thyroid cancer, Iron deficiency anemia due to dietary causes (10/03/2014), PONV (postoperative nausea and vomiting), Postoperative hypothyroidism, Sleep apnea, Thyroid disease, and Wears hearing aid in both ears. She does not have any pertinent problems on file. She  has a past surgical history that includes Total vaginal hysterectomy (12/2013); Bariatric Surgery (09/2012); Laparoscopic cholecystectomy (09/1996); lower body lift (12/2014); Brachioplasty (06/2015); breast lift (06/2015); Breast enhancement surgery (06/2015); Colonoscopy with propofol (N/A, 11/08/2015); Blepharoptosis repair; Breast cyst aspiration (Right); Thyroid lobectomy (Right); Parathyroidectomy (Left); Augmentation mammaplasty (Bilateral, 09/2015); Cataract extraction w/PHACO (Left, 03/23/2023); and Cataract extraction w/PHACO (Right, 04/06/2023). Her family history includes Arthritis in her maternal grandmother and mother; Cancer in her father, paternal grandfather, and paternal grandmother; Hyperlipidemia in her father and mother; Lung cancer in her sister; Stroke in her maternal grandmother and paternal grandfather; Vision loss in her father. She  reports that she  quit smoking about 38 years ago. Her smoking use included cigarettes. She started smoking about 53 years ago. She has a 3.8 pack-year smoking history. She has never used smokeless tobacco. She reports current alcohol use. She reports that she does not use drugs. She has a current medication list which includes the following prescription(s): atorvastatin, calcium carb-cholecalciferol, cholecalciferol, levothyroxine, multivitamin, vitamin b-12, and estradiol. She has no known allergies.       Review of Systems:  Review of Systems  Constitutional: Denied constitutional symptoms, night sweats, recent illness, fatigue, fever, insomnia and weight loss.  Eyes: Denied eye symptoms, eye pain, photophobia, vision change and visual disturbance.  Ears/Nose/Throat/Neck: Denied ear, nose, throat or neck symptoms, hearing loss, nasal discharge, sinus congestion and sore throat.  Cardiovascular: Denied cardiovascular symptoms, arrhythmia, chest pain/pressure, edema, exercise intolerance, orthopnea and palpitations.  Respiratory: Denied pulmonary symptoms, asthma, pleuritic pain, productive sputum, cough, dyspnea and wheezing.  Gastrointestinal: Denied, gastro-esophageal reflux, melena, nausea and vomiting.  Genitourinary: Denied genitourinary symptoms including symptomatic vaginal discharge, pelvic relaxation issues, and urinary complaints.  Musculoskeletal: Denied musculoskeletal symptoms, stiffness, swelling, muscle weakness and myalgia.  Dermatologic: Denied dermatology symptoms, rash and scar.  Neurologic: Denied neurology symptoms, dizziness, headache, neck pain and syncope.  Psychiatric: Denied psychiatric symptoms, anxiety and depression.  Endocrine: Denied endocrine symptoms including hot flashes and night sweats.   Meds:   Current Outpatient Medications on File Prior to Visit  Medication Sig Dispense Refill   atorvastatin (LIPITOR) 10 MG tablet Take 10 mg by mouth daily.     Calcium  Carb-Cholecalciferol (CALCIUM 1000 + D PO) Take 1,000 mg by mouth daily.     cholecalciferol (VITAMIN D3) 25 MCG (1000 UNIT) tablet Take 1,000 Units by  mouth daily.     levothyroxine (SYNTHROID) 100 MCG tablet Take 100 mcg by mouth daily before breakfast.     Multiple Vitamin (MULTIVITAMIN) capsule Take 1 capsule by mouth daily.     vitamin B-12 (CYANOCOBALAMIN) 500 MCG tablet Take 500 mcg by mouth every other day.     No current facility-administered medications on file prior to visit.     Objective:     Vitals:   04/09/23 0921 04/09/23 0950  BP: (!) 157/87 (!) 151/93  Pulse: 71     Filed Weights   04/09/23 0921  Weight: 229 lb 12.8 oz (104.2 kg)              Physical examination General NAD, Conversant  HEENT Atraumatic; Op clear with mmm.  Normo-cephalic.  Anicteric sclerae  Thyroid/Neck Smooth without nodularity or enlargement. Normal ROM.  Neck Supple.  Skin No rashes, lesions or ulceration. Normal palpated skin turgor. No nodularity.  Breasts: No masses or discharge.  Symmetric.  No axillary adenopathy.  Lungs: Clear to auscultation.No rales or wheezes. Normal Respiratory effort, no retractions.  Heart: NSR.  No murmurs or rubs appreciated. No peripheral edema  Abdomen: Soft.  Non-tender.  No masses.  No HSM. No hernia  Extremities: Moves all appropriately.  Normal ROM for age. No lymphadenopathy.  Neuro: Oriented to PPT.  Normal mood. Normal affect.     Pelvic:   Vulva: Normal appearance.  No lesions.  Vagina: No lesions or abnormalities noted.  Support: Fourth degree vaginal vault prolapse.  Urethra No masses tenderness or scarring.  Meatus Normal size without lesions or prolapse.  Cervix: Surgically absent  Anus: Normal exam.  No lesions.  Perineum: Normal exam.  No lesions.        Bimanual   Uterus: Surgically absent  Adnexae: No masses.  Non-tender to palpation.  Cul-de-sac: Negative for abnormality.     Assessment:    [id number] Patient Active Problem  List   Diagnosis Date Noted   Special screening for malignant neoplasms, colon    Chronic constipation 10/03/2014   Post-menopause on HRT (hormone replacement therapy) 10/03/2014   S/P TAH-BSO (total abdominal hysterectomy and bilateral salpingo-oophorectomy) 10/03/2014   Bariatric surgery status 10/03/2014   Iron deficiency anemia due to dietary causes 10/03/2014     1. Well woman exam with routine gynecological exam   2. Screening mammogram for breast cancer   3. Postmenopausal hormone therapy     Patient has significant vaginal vault prolapse but is relatively asymptomatic.   Plan:            1.  Basic Screening Recommendations The basic screening recommendations for asymptomatic women were discussed with the patient during her visit.  The age-appropriate recommendations were discussed with her and the rational for the tests reviewed.  When I am informed by the patient that another primary care physician has previously obtained the age-appropriate tests and they are up-to-date, only outstanding tests are ordered and referrals given as necessary.  Abnormal results of tests will be discussed with her when all of her results are completed.  Routine preventative health maintenance measures emphasized: Exercise/Diet/Weight control, Tobacco Warnings, Alcohol/Substance use risks and Stress Management 2.  We have discussed pelvic prolapse in some detail.  Possibility of future surgery discussed.  As patient is having relatively few symptoms there is nothing to do at this time. 3.  Continue ERT. Orders Orders Placed This Encounter  Procedures   MM DIGITAL SCREENING BILATERAL     Meds ordered  this encounter  Medications   estradiol (ESTRACE) 1 MG tablet    Sig: Take 1 tablet (1 mg total) by mouth daily.    Dispense:  90 tablet    Refill:  3          F/U  Return in about 1 year (around 04/08/2024) for Annual Physical.  Elonda Husky, M.D. 04/09/2023 10:06 AM

## 2023-04-09 NOTE — Progress Notes (Signed)
Patients presents for annual exam today. She states doing well with current HRT, refilled. Due for mammogram, ordered. Annual labs are deferred to PCP, appointment scheduled in January.  History of hysterectomy. She states no other questions or concerns at this time.

## 2023-04-30 ENCOUNTER — Ambulatory Visit
Admission: RE | Admit: 2023-04-30 | Discharge: 2023-04-30 | Disposition: A | Discharge status: Home / Self Care | Payer: Medicare HMO | Source: Ambulatory Visit | Attending: Obstetrics and Gynecology | Admitting: Obstetrics and Gynecology

## 2023-04-30 DIAGNOSIS — Z1231 Encounter for screening mammogram for malignant neoplasm of breast: Secondary | ICD-10-CM | POA: Diagnosis present

## 2023-04-30 DIAGNOSIS — Z01419 Encounter for gynecological examination (general) (routine) without abnormal findings: Secondary | ICD-10-CM | POA: Diagnosis present

## 2023-07-31 ENCOUNTER — Other Ambulatory Visit: Payer: Self-pay | Admitting: Orthopedic Surgery

## 2023-08-11 ENCOUNTER — Encounter
Admission: RE | Admit: 2023-08-11 | Discharge: 2023-08-11 | Disposition: A | Discharge status: Home / Self Care | Source: Ambulatory Visit | Attending: Orthopedic Surgery | Admitting: Orthopedic Surgery

## 2023-08-11 ENCOUNTER — Other Ambulatory Visit: Payer: Self-pay

## 2023-08-11 VITALS — BP 132/77 | HR 74 | Temp 97.7°F | Resp 14 | Ht 67.0 in | Wt 224.0 lb

## 2023-08-11 DIAGNOSIS — Z01818 Encounter for other preprocedural examination: Secondary | ICD-10-CM | POA: Insufficient documentation

## 2023-08-11 DIAGNOSIS — Z01812 Encounter for preprocedural laboratory examination: Secondary | ICD-10-CM

## 2023-08-11 HISTORY — DX: Pneumonia, unspecified organism: J18.9

## 2023-08-11 HISTORY — DX: Bilateral primary osteoarthritis of knee: M17.0

## 2023-08-11 LAB — COMPREHENSIVE METABOLIC PANEL WITH GFR
ALT: 18 U/L (ref 0–44)
AST: 22 U/L (ref 15–41)
Albumin: 4 g/dL (ref 3.5–5.0)
Alkaline Phosphatase: 63 U/L (ref 38–126)
Anion gap: 6 (ref 5–15)
BUN: 18 mg/dL (ref 8–23)
CO2: 26 mmol/L (ref 22–32)
Calcium: 9.1 mg/dL (ref 8.9–10.3)
Chloride: 107 mmol/L (ref 98–111)
Creatinine, Ser: 0.7 mg/dL (ref 0.44–1.00)
GFR, Estimated: >60 mL/min (ref >60)
Glucose, Bld: 103 mg/dL — ABNORMAL HIGH (ref 70–99)
Potassium: 4.2 mmol/L (ref 3.5–5.1)
Sodium: 139 mmol/L (ref 135–145)
Total Bilirubin: 0.9 mg/dL (ref 0.0–1.2)
Total Protein: 7.1 g/dL (ref 6.5–8.1)

## 2023-08-11 LAB — CBC WITH DIFFERENTIAL/PLATELET
Abs Immature Granulocytes: 0.01 10*3/uL (ref 0.00–0.07)
Basophils Absolute: 0 10*3/uL (ref 0.0–0.1)
Basophils Relative: 1 %
Eosinophils Absolute: 0.1 10*3/uL (ref 0.0–0.5)
Eosinophils Relative: 2 %
HCT: 37.2 % (ref 36.0–46.0)
Hemoglobin: 12.4 g/dL (ref 12.0–15.0)
Immature Granulocytes: 0 %
Lymphocytes Relative: 26 %
Lymphs Abs: 0.9 10*3/uL (ref 0.7–4.0)
MCH: 31.3 pg (ref 26.0–34.0)
MCHC: 33.3 g/dL (ref 30.0–36.0)
MCV: 93.9 fL (ref 80.0–100.0)
Monocytes Absolute: 0.3 10*3/uL (ref 0.1–1.0)
Monocytes Relative: 8 %
Neutro Abs: 2.4 10*3/uL (ref 1.7–7.7)
Neutrophils Relative %: 63 %
Platelets: 238 10*3/uL (ref 150–400)
RBC: 3.96 MIL/uL (ref 3.87–5.11)
RDW: 12.9 % (ref 11.5–15.5)
WBC: 3.7 10*3/uL — ABNORMAL LOW (ref 4.0–10.5)
nRBC: 0 % (ref 0.0–0.2)

## 2023-08-11 LAB — URINALYSIS, ROUTINE W REFLEX MICROSCOPIC
Bilirubin Urine: NEGATIVE
Glucose, UA: NEGATIVE mg/dL
Hgb urine dipstick: NEGATIVE
Ketones, ur: NEGATIVE mg/dL
Leukocytes,Ua: NEGATIVE
Nitrite: NEGATIVE
Protein, ur: NEGATIVE mg/dL
Specific Gravity, Urine: 1.014 (ref 1.005–1.030)
pH: 5 (ref 5.0–8.0)

## 2023-08-11 LAB — SURGICAL PCR SCREEN
MRSA, PCR: NEGATIVE
Staphylococcus aureus: NEGATIVE

## 2023-08-11 NOTE — Patient Instructions (Signed)
 Your procedure is scheduled on: Monday, April 28 Report to the Registration Desk on the 1st floor of the [facility] To find out your arrival time, please call [phone] between 1PM - 3PM on: Friday, April 25 If your arrival time is 6:00 am, do not arrive before that time as the [facility] entrance doors do not open until 6:00 am.  REMEMBER: Instructions that are not followed completely may result in serious medical risk, up to and including death; or upon the discretion of your surgeon and anesthesiologist your surgery may need to be rescheduled.  Do not eat food after midnight the night before surgery.  No gum chewing or hard candies.  You may however, drink CLEAR liquids up to 2 hours before you are scheduled to arrive for your surgery. Do not drink anything within 2 hours of your scheduled arrival time.  Clear liquids include: - water  - apple juice without pulp - gatorade (not RED colors) - black coffee or tea (Do NOT add milk or creamers to the coffee or tea) Do NOT drink anything that is not on this list.  In addition, your doctor has ordered for you to drink the provided:  Ensure Pre-Surgery Clear Carbohydrate Drink  Drinking this carbohydrate drink up to two hours before surgery helps to reduce insulin resistance and improve patient outcomes. Please complete drinking 2 hours before scheduled arrival time.  One week prior to surgery: starting April 21 Stop Anti-inflammatories (NSAIDS) such as Advil, Aleve, Ibuprofen, Motrin, Naproxen, Naprosyn and Aspirin based products such as Excedrin, Goody's Powder, BC Powder. Stop ANY OVER THE COUNTER supplements until after surgery. Stop calcium, vitamin D, vitamin B, multiple vitamins.  You may however, continue to take Tylenol if needed for pain up until the day of surgery.  Continue taking all of your other prescription medications up until the day of surgery.  ON THE DAY OF SURGERY ONLY TAKE THESE MEDICATIONS WITH SIPS OF  WATER:  levothyroxine (SYNTHROID)   No Alcohol for 24 hours before or after surgery.  No Smoking including e-cigarettes for 24 hours before surgery.  No chewable tobacco products for at least 6 hours before surgery.  No nicotine patches on the day of surgery.  Do not use any "recreational" drugs for at least a week (preferably 2 weeks) before your surgery.  Please be advised that the combination of cocaine and anesthesia may have negative outcomes, up to and including death. If you test positive for cocaine, your surgery will be cancelled.  On the morning of surgery brush your teeth with toothpaste and water, you may rinse your mouth with mouthwash if you wish. Do not swallow any toothpaste or mouthwash.  Use CHG Soap as directed on instruction sheet.  Do not wear jewelry, make-up, hairpins, clips or nail polish.  For welded (permanent) jewelry: bracelets, anklets, waist bands, etc.  Please have this removed prior to surgery.  If it is not removed, there is a chance that hospital personnel will need to cut it off on the day of surgery.  Do not wear lotions, powders, or perfumes.   Do not shave body hair from the neck down 48 hours before surgery.  Contact lenses, hearing aids and dentures may not be worn into surgery.  Do not bring valuables to the hospital. [facility] is not responsible for any missing/lost belongings or valuables.   Notify your doctor if there is any change in your medical condition (cold, fever, infection).  Wear comfortable clothing (specific to  your surgery type) to the hospital.  After surgery, you can help prevent lung complications by doing breathing exercises.  Take deep breaths and cough every 1-2 hours. Your doctor may order a device called an Incentive Spirometer to help you take deep breaths.  If you are being admitted to the hospital overnight, leave your suitcase in the car. After surgery it may be brought to your room.  In case of increased  patient census, it may be necessary for you, the patient, to continue your postoperative care in the Same Day Surgery department.  If you are being discharged the day of surgery, you will not be allowed to drive home. You will need a responsible individual to drive you home and stay with you for 24 hours after surgery.   If you are taking public transportation, you will need to have a responsible individual with you.  Please call the Pre-admissions Testing Dept. at [phone] if you have any questions about these instructions.  Surgery Visitation Policy:  Patients having surgery or a procedure may have two visitors.  Children under the age of 7 must have an adult with them who is not the patient.  Inpatient Visitation:    Visiting hours are 7 a.m. to 8 p.m. Up to four visitors are allowed at one time in a patient room. The visitors may rotate out with other people during the day.  One visitor age 68 or older may stay with the patient overnight and must be in the room by 8 p.m.       Pre-operative 5 CHG Bath Instructions   You can play a key role in reducing the risk of infection after surgery. Your skin needs to be as free of germs as possible. You can reduce the number of germs on your skin by washing with CHG (chlorhexidine gluconate) soap before surgery. CHG is an antiseptic soap that kills germs and continues to kill germs even after washing.   DO NOT use if you have an allergy to chlorhexidine/CHG or antibacterial soaps. If your skin becomes reddened or irritated, stop using the CHG and notify one of our RNs at [phone].   Please shower with the CHG soap starting 4 days before surgery using the following schedule:     Please keep in mind the following:  DO NOT shave, including legs and underarms, starting the day of your first shower.   You may shave your face at any point before/day of surgery.  Place clean sheets on your bed the day you start using CHG soap. Use  a clean washcloth (not used since being washed) for each shower. DO NOT sleep with pets once you start using the CHG.   CHG Shower Instructions:  If you choose to wash your hair and private area, wash first with your normal shampoo/soap.  After you use shampoo/soap, rinse your hair and body thoroughly to remove shampoo/soap residue.  Turn the water OFF and apply about 3 tablespoons (45 ml) of CHG soap to a CLEAN washcloth.  Apply CHG soap ONLY FROM YOUR NECK DOWN TO YOUR TOES (washing for 3-5 minutes)  DO NOT use CHG soap on face, private areas, open wounds, or sores.  Pay special attention to the area where your surgery is being performed.  If you are having back surgery, having someone wash your back for you may be helpful. Wait 2 minutes after CHG soap is applied, then you may rinse off the CHG soap.  Pat dry with a clean  towel  Put on clean clothes/pajamas   If you choose to wear lotion, please use ONLY the CHG-compatible lotions on the back of this paper.     Additional instructions for the day of surgery: DO NOT APPLY any lotions, deodorants, cologne, or perfumes.   Put on clean/comfortable clothes.  Brush your teeth.  Ask your nurse before applying any prescription medications to the skin.      CHG Compatible Lotions   Aveeno Moisturizing lotion  Cetaphil Moisturizing Cream  Cetaphil Moisturizing Lotion  Clairol Herbal Essence Moisturizing Lotion, Dry Skin  Clairol Herbal Essence Moisturizing Lotion, Extra Dry Skin  Clairol Herbal Essence Moisturizing Lotion, Normal Skin  Curel Age Defying Therapeutic Moisturizing Lotion with Alpha Hydroxy  Curel Extreme Care Body Lotion  Curel Soothing Hands Moisturizing Hand Lotion  Curel Therapeutic Moisturizing Cream, Fragrance-Free  Curel Therapeutic Moisturizing Lotion, Fragrance-Free  Curel Therapeutic Moisturizing Lotion, Original Formula  Eucerin Daily Replenishing Lotion  Eucerin Dry Skin Therapy Plus Alpha Hydroxy Crme   Eucerin Dry Skin Therapy Plus Alpha Hydroxy Lotion  Eucerin Original Crme  Eucerin Original Lotion  Eucerin Plus Crme Eucerin Plus Lotion  Eucerin TriLipid Replenishing Lotion  Keri Anti-Bacterial Hand Lotion  Keri Deep Conditioning Original Lotion Dry Skin Formula Softly Scented  Keri Deep Conditioning Original Lotion, Fragrance Free Sensitive Skin Formula  Keri Lotion Fast Absorbing Fragrance Free Sensitive Skin Formula  Keri Lotion Fast Absorbing Softly Scented Dry Skin Formula  Keri Original Lotion  Keri Skin Renewal Lotion Keri Silky Smooth Lotion  Keri Silky Smooth Sensitive Skin Lotion  Nivea Body Creamy Conditioning Oil  Nivea Body Extra Enriched Lotion  Nivea Body Original Lotion  Nivea Body Sheer Moisturizing Lotion Nivea Crme  Nivea Skin Firming Lotion  NutraDerm 30 Skin Lotion  NutraDerm Skin Lotion  NutraDerm Therapeutic Skin Cream  NutraDerm Therapeutic Skin Lotion  ProShield Protective Hand Cream  Provon moisturizing lotion        Preoperative Educational Videos for Total Hip, Knee and Shoulder Replacements  To better prepare for surgery, please view our videos that explain the physical activity and discharge planning required to have the best surgical recovery at [facility].  [email or web address]  Questions? Call [phone] or email [email or web address]

## 2023-08-23 MED ORDER — LACTATED RINGERS IV SOLN: INTRAVENOUS | Status: DC

## 2023-08-23 MED ORDER — ORAL CARE MOUTH RINSE: 15.0000 mL | Freq: Once | OROMUCOSAL | Status: DC

## 2023-08-23 MED ORDER — CEFAZOLIN SODIUM-DEXTROSE 2-4 GM/100ML-% IV SOLN
2.0000 g | INTRAVENOUS | Status: AC
  Administered 2023-08-24: 2 g via INTRAVENOUS

## 2023-08-23 MED ORDER — CHLORHEXIDINE GLUCONATE 0.12 % MT SOLN: 15.0000 mL | Freq: Once | OROMUCOSAL | Status: DC

## 2023-08-23 MED ORDER — TRANEXAMIC ACID-NACL 1000-0.7 MG/100ML-% IV SOLN
1000.0000 mg | INTRAVENOUS | Status: AC
  Administered 2023-08-24 (×2): 1000 mg via INTRAVENOUS

## 2023-08-23 MED ORDER — DEXAMETHASONE SODIUM PHOSPHATE 10 MG/ML IJ SOLN
8.0000 mg | Freq: Once | INTRAMUSCULAR | Status: AC
  Administered 2023-08-24: 8 mg via INTRAVENOUS

## 2023-08-24 ENCOUNTER — Ambulatory Visit

## 2023-08-24 ENCOUNTER — Other Ambulatory Visit: Payer: Self-pay

## 2023-08-24 ENCOUNTER — Ambulatory Visit: Admitting: General Practice

## 2023-08-24 ENCOUNTER — Encounter: Payer: Self-pay | Admitting: Orthopedic Surgery

## 2023-08-24 ENCOUNTER — Ambulatory Visit
Admission: RE | Admit: 2023-08-24 | Discharge: 2023-08-25 | Disposition: A | Discharge status: Home Health | Attending: Orthopedic Surgery | Admitting: Orthopedic Surgery

## 2023-08-24 ENCOUNTER — Ambulatory Visit: Payer: Self-pay | Admitting: Urgent Care

## 2023-08-24 ENCOUNTER — Encounter
Admission: RE | Disposition: A | Discharge status: Home Health | Payer: Self-pay | Source: Home / Self Care | Attending: Orthopedic Surgery

## 2023-08-24 DIAGNOSIS — E890 Postprocedural hypothyroidism: Secondary | ICD-10-CM | POA: Diagnosis not present

## 2023-08-24 DIAGNOSIS — Z87891 Personal history of nicotine dependence: Secondary | ICD-10-CM | POA: Insufficient documentation

## 2023-08-24 DIAGNOSIS — Z9884 Bariatric surgery status: Secondary | ICD-10-CM | POA: Insufficient documentation

## 2023-08-24 DIAGNOSIS — M1712 Unilateral primary osteoarthritis, left knee: Secondary | ICD-10-CM | POA: Insufficient documentation

## 2023-08-24 DIAGNOSIS — R262 Difficulty in walking, not elsewhere classified: Secondary | ICD-10-CM | POA: Diagnosis not present

## 2023-08-24 DIAGNOSIS — Z96652 Presence of left artificial knee joint: Secondary | ICD-10-CM

## 2023-08-24 HISTORY — PX: TOTAL KNEE ARTHROPLASTY: SHX125

## 2023-08-24 SURGERY — ARTHROPLASTY, KNEE, TOTAL
Anesthesia: Spinal | Site: Knee | Laterality: Left

## 2023-08-24 MED ORDER — BUPIVACAINE LIPOSOME 1.3 % IJ SUSP
INTRAMUSCULAR | Status: AC
  Filled 2023-08-24: qty 20

## 2023-08-24 MED ORDER — ESTRADIOL 0.5 MG PO TABS
1.0000 mg | ORAL_TABLET | Freq: Every day | ORAL | Status: DC
  Filled 2023-08-24: qty 2

## 2023-08-24 MED ORDER — FENTANYL CITRATE (PF) 100 MCG/2ML IJ SOLN
INTRAMUSCULAR | Status: AC
Start: 2023-08-24 — End: ?
  Filled 2023-08-24: qty 2

## 2023-08-24 MED ORDER — TRANEXAMIC ACID-NACL 1000-0.7 MG/100ML-% IV SOLN
INTRAVENOUS | Status: AC
  Filled 2023-08-24: qty 100

## 2023-08-24 MED ORDER — MENTHOL 3 MG MT LOZG: 1.0000 | LOZENGE | OROMUCOSAL | Status: DC | PRN

## 2023-08-24 MED ORDER — DEXAMETHASONE SODIUM PHOSPHATE 10 MG/ML IJ SOLN
INTRAMUSCULAR | Status: AC
  Filled 2023-08-24: qty 1

## 2023-08-24 MED ORDER — LEVOTHYROXINE SODIUM 25 MCG PO TABS
100.0000 ug | ORAL_TABLET | Freq: Every day | ORAL | Status: DC
  Administered 2023-08-25: 100 ug via ORAL
  Filled 2023-08-24: qty 1
  Filled 2023-08-24: qty 4

## 2023-08-24 MED ORDER — KETOROLAC TROMETHAMINE 15 MG/ML IJ SOLN
7.5000 mg | Freq: Four times a day (QID) | INTRAMUSCULAR | Status: DC
  Administered 2023-08-24 – 2023-08-25 (×3): 7.5 mg via INTRAVENOUS
  Filled 2023-08-24 (×3): qty 1

## 2023-08-24 MED ORDER — METOCLOPRAMIDE HCL 5 MG PO TABS: 5.0000 mg | ORAL_TABLET | Freq: Three times a day (TID) | ORAL | Status: DC | PRN

## 2023-08-24 MED ORDER — CEFAZOLIN SODIUM-DEXTROSE 2-4 GM/100ML-% IV SOLN
2.0000 g | Freq: Four times a day (QID) | INTRAVENOUS | Status: AC
  Administered 2023-08-24 – 2023-08-25 (×2): 2 g via INTRAVENOUS
  Filled 2023-08-24 (×4): qty 100

## 2023-08-24 MED ORDER — ACETAMINOPHEN 325 MG PO TABS: 325.0000 mg | ORAL_TABLET | Freq: Four times a day (QID) | ORAL | Status: DC | PRN

## 2023-08-24 MED ORDER — OXYCODONE HCL 5 MG/5ML PO SOLN: 5.0000 mg | Freq: Once | ORAL | Status: DC | PRN

## 2023-08-24 MED ORDER — ACETAMINOPHEN 500 MG PO TABS
1000.0000 mg | ORAL_TABLET | Freq: Three times a day (TID) | ORAL | Status: DC
  Administered 2023-08-25 (×2): 1000 mg via ORAL
  Filled 2023-08-24 (×2): qty 2

## 2023-08-24 MED ORDER — MIDAZOLAM HCL 5 MG/5ML IJ SOLN
INTRAMUSCULAR | Status: DC | PRN
  Administered 2023-08-24 (×2): 1 mg via INTRAVENOUS

## 2023-08-24 MED ORDER — CEFAZOLIN SODIUM-DEXTROSE 2-4 GM/100ML-% IV SOLN
INTRAVENOUS | Status: AC
  Filled 2023-08-24: qty 100

## 2023-08-24 MED ORDER — SODIUM CHLORIDE (PF) 0.9 % IJ SOLN
INTRAMUSCULAR | Status: AC
  Filled 2023-08-24: qty 20

## 2023-08-24 MED ORDER — MORPHINE SULFATE (PF) 4 MG/ML IV SOLN: 0.5000 mg | INTRAVENOUS | Status: DC | PRN

## 2023-08-24 MED ORDER — FENTANYL CITRATE (PF) 100 MCG/2ML IJ SOLN: 25.0000 ug | INTRAMUSCULAR | Status: DC | PRN

## 2023-08-24 MED ORDER — ENOXAPARIN SODIUM 30 MG/0.3ML IJ SOSY
30.0000 mg | PREFILLED_SYRINGE | Freq: Two times a day (BID) | INTRAMUSCULAR | Status: DC
  Administered 2023-08-25: 30 mg via SUBCUTANEOUS
  Filled 2023-08-24: qty 0.3

## 2023-08-24 MED ORDER — EPHEDRINE SULFATE-NACL 50-0.9 MG/10ML-% IV SOSY
PREFILLED_SYRINGE | INTRAVENOUS | Status: DC | PRN
Start: 2023-08-24 — End: 2023-08-24
  Administered 2023-08-24 (×2): 5 mg via INTRAVENOUS

## 2023-08-24 MED ORDER — OXYCODONE HCL 5 MG PO TABS: 5.0000 mg | ORAL_TABLET | Freq: Once | ORAL | Status: DC | PRN

## 2023-08-24 MED ORDER — PROPOFOL 1000 MG/100ML IV EMUL
INTRAVENOUS | Status: AC
  Filled 2023-08-24: qty 100

## 2023-08-24 MED ORDER — SODIUM CHLORIDE 0.9 % IR SOLN
Status: DC | PRN
  Administered 2023-08-24: 3000 mL

## 2023-08-24 MED ORDER — FENTANYL CITRATE (PF) 100 MCG/2ML IJ SOLN
INTRAMUSCULAR | Status: DC | PRN
  Administered 2023-08-24: 25 ug via INTRAVENOUS

## 2023-08-24 MED ORDER — ONDANSETRON HCL 4 MG PO TABS: 4.0000 mg | ORAL_TABLET | Freq: Four times a day (QID) | ORAL | Status: DC | PRN

## 2023-08-24 MED ORDER — ACETAMINOPHEN 10 MG/ML IV SOLN
INTRAVENOUS | Status: AC
  Filled 2023-08-24: qty 100

## 2023-08-24 MED ORDER — HYDROCODONE-ACETAMINOPHEN 5-325 MG PO TABS
1.0000 | ORAL_TABLET | ORAL | Status: DC | PRN
  Filled 2023-08-24: qty 1

## 2023-08-24 MED ORDER — TRAMADOL HCL 50 MG PO TABS
50.0000 mg | ORAL_TABLET | Freq: Four times a day (QID) | ORAL | Status: DC | PRN
  Administered 2023-08-24: 50 mg via ORAL
  Filled 2023-08-24: qty 1

## 2023-08-24 MED ORDER — PHENOL 1.4 % MT LIQD: 1.0000 | OROMUCOSAL | Status: DC | PRN

## 2023-08-24 MED ORDER — BUPIVACAINE HCL (PF) 0.5 % IJ SOLN
INTRAMUSCULAR | Status: DC | PRN
  Administered 2023-08-24: 3 mL

## 2023-08-24 MED ORDER — PHENYLEPHRINE HCL-NACL 20-0.9 MG/250ML-% IV SOLN
INTRAVENOUS | Status: DC | PRN
  Administered 2023-08-24: 20 ug/min via INTRAVENOUS

## 2023-08-24 MED ORDER — METOCLOPRAMIDE HCL 5 MG/ML IJ SOLN: 5.0000 mg | Freq: Three times a day (TID) | INTRAMUSCULAR | Status: DC | PRN

## 2023-08-24 MED ORDER — ONDANSETRON HCL 4 MG/2ML IJ SOLN: 4.0000 mg | Freq: Four times a day (QID) | INTRAMUSCULAR | Status: DC | PRN

## 2023-08-24 MED ORDER — SODIUM CHLORIDE 0.9 % IV SOLN: INTRAVENOUS | Status: DC

## 2023-08-24 MED ORDER — EPHEDRINE 5 MG/ML INJ
INTRAVENOUS | Status: AC
  Filled 2023-08-24: qty 5

## 2023-08-24 MED ORDER — DOCUSATE SODIUM 100 MG PO CAPS
100.0000 mg | ORAL_CAPSULE | Freq: Two times a day (BID) | ORAL | Status: DC
  Administered 2023-08-24 – 2023-08-25 (×2): 100 mg via ORAL
  Filled 2023-08-24: qty 1

## 2023-08-24 MED ORDER — PANTOPRAZOLE SODIUM 40 MG PO TBEC
40.0000 mg | DELAYED_RELEASE_TABLET | Freq: Every day | ORAL | Status: DC
  Administered 2023-08-24 – 2023-08-25 (×2): 40 mg via ORAL
  Filled 2023-08-24 (×2): qty 1

## 2023-08-24 MED ORDER — SODIUM CHLORIDE (PF) 0.9 % IJ SOLN
INTRAMUSCULAR | Status: DC | PRN
  Administered 2023-08-24: 71 mL

## 2023-08-24 MED ORDER — ONDANSETRON HCL 4 MG/2ML IJ SOLN
INTRAMUSCULAR | Status: DC | PRN
  Administered 2023-08-24: 4 mg via INTRAVENOUS

## 2023-08-24 MED ORDER — ATORVASTATIN CALCIUM 10 MG PO TABS
10.0000 mg | ORAL_TABLET | Freq: Every evening | ORAL | Status: DC
  Administered 2023-08-24: 10 mg via ORAL
  Filled 2023-08-24: qty 1

## 2023-08-24 MED ORDER — MIDAZOLAM HCL 2 MG/2ML IJ SOLN
INTRAMUSCULAR | Status: AC
  Filled 2023-08-24: qty 2

## 2023-08-24 MED ORDER — SURGIPHOR WOUND IRRIGATION SYSTEM - OPTIME
TOPICAL | Status: DC | PRN
  Administered 2023-08-24: 450 mL

## 2023-08-24 MED ORDER — DOCUSATE SODIUM 100 MG PO CAPS
ORAL_CAPSULE | ORAL | Status: AC
  Filled 2023-08-24: qty 1

## 2023-08-24 MED ORDER — PROPOFOL 500 MG/50ML IV EMUL
INTRAVENOUS | Status: DC | PRN
  Administered 2023-08-24: 30 mg via INTRAVENOUS
  Administered 2023-08-24: 75 ug/kg/min via INTRAVENOUS

## 2023-08-24 MED ORDER — ACETAMINOPHEN 10 MG/ML IV SOLN
INTRAVENOUS | Status: DC | PRN
  Administered 2023-08-24: 1000 mg via INTRAVENOUS

## 2023-08-24 MED ORDER — BUPIVACAINE-EPINEPHRINE (PF) 0.25% -1:200000 IJ SOLN
INTRAMUSCULAR | Status: AC
  Filled 2023-08-24: qty 30

## 2023-08-24 SURGICAL SUPPLY — 63 items
BLADE PATELLA REAM PILOT HOLE (MISCELLANEOUS) IMPLANT
BLADE SAW 90X13X1.19 OSCILLAT (BLADE) IMPLANT
BLADE SAW SAG 25X90X1.19 (BLADE) ×1 IMPLANT
BLADE SAW SAG 29X58X.64 (BLADE) ×1 IMPLANT
BNDG ELASTIC 6INX 5YD STR LF (GAUZE/BANDAGES/DRESSINGS) ×1 IMPLANT
BOWL CEMENT MIX W/ADAPTER (MISCELLANEOUS) IMPLANT
BRUSH SCRUB EZ PLAIN DRY (MISCELLANEOUS) ×1 IMPLANT
CHLORAPREP W/TINT 26 (MISCELLANEOUS) ×2 IMPLANT
COMPONENT FEM KNEE STD PS 7 LT (Joint) IMPLANT
COMPONENT PATELLA 3 PEG 35 (Joint) IMPLANT
COOLER POLAR GLACIER W/PUMP (MISCELLANEOUS) ×1 IMPLANT
CUFF TRNQT CYL 24X4X16.5-23 (TOURNIQUET CUFF) IMPLANT
CUFF TRNQT CYL 30X4X21-28X (TOURNIQUET CUFF) IMPLANT
CUFF TRNQT CYL 34X4X40X1 (TOURNIQUET CUFF) IMPLANT
DERMABOND ADVANCED .7 DNX12 (GAUZE/BANDAGES/DRESSINGS) ×1 IMPLANT
DRAPE INCISE IOBAN 66X45 STRL (DRAPES) IMPLANT
DRAPE SHEET LG 3/4 BI-LAMINATE (DRAPES) ×2 IMPLANT
DRSG MEPILEX SACRM 8.7X9.8 (GAUZE/BANDAGES/DRESSINGS) ×1 IMPLANT
DRSG OPSITE POSTOP 4X10 (GAUZE/BANDAGES/DRESSINGS) IMPLANT
DRSG OPSITE POSTOP 4X8 (GAUZE/BANDAGES/DRESSINGS) IMPLANT
ELECTRODE REM PT RTRN 9FT ADLT (ELECTROSURGICAL) ×1 IMPLANT
GLOVE BIO SURGEON STRL SZ8 (GLOVE) ×1 IMPLANT
GLOVE BIOGEL PI IND STRL 8 (GLOVE) ×1 IMPLANT
GLOVE PI ORTHO PRO STRL 7.5 (GLOVE) ×2 IMPLANT
GLOVE PI ORTHO PRO STRL SZ8 (GLOVE) ×2 IMPLANT
GLOVE SURG SYN 7.5 E (GLOVE) ×1 IMPLANT
GLOVE SURG SYN 7.5 PF PI (GLOVE) ×1 IMPLANT
GOWN SRG XL LVL 3 NONREINFORCE (GOWNS) ×1 IMPLANT
GOWN STRL REUS W/ TWL LRG LVL3 (GOWN DISPOSABLE) ×1 IMPLANT
GOWN STRL REUS W/ TWL XL LVL3 (GOWN DISPOSABLE) ×1 IMPLANT
HOOD PEEL AWAY T7 (MISCELLANEOUS) ×2 IMPLANT
KIT TURNOVER KIT A (KITS) ×1 IMPLANT
MANIFOLD NEPTUNE II (INSTRUMENTS) ×1 IMPLANT
MARKER SKIN DUAL TIP RULER LAB (MISCELLANEOUS) ×1 IMPLANT
MAT ABSORB FLUID 56X50 GRAY (MISCELLANEOUS) ×1 IMPLANT
NDL HYPO 21X1.5 SAFETY (NEEDLE) ×1 IMPLANT
NEEDLE HYPO 21X1.5 SAFETY (NEEDLE) ×1 IMPLANT
PACK TOTAL KNEE (MISCELLANEOUS) ×1 IMPLANT
PAD ARMBOARD POSITIONER FOAM (MISCELLANEOUS) ×3 IMPLANT
PAD WRAPON POLAR KNEE (MISCELLANEOUS) ×1 IMPLANT
PENCIL SMOKE EVACUATOR (MISCELLANEOUS) ×1 IMPLANT
PIN DRILL HDLS TROCAR 75 4PK (PIN) IMPLANT
SCREW FEMALE HEX FIX 25X2.5 (ORTHOPEDIC DISPOSABLE SUPPLIES) IMPLANT
SCREW HEX HEADED 3.5X27 DISP (ORTHOPEDIC DISPOSABLE SUPPLIES) IMPLANT
SLEEVE SCD COMPRESS KNEE MED (STOCKING) ×1 IMPLANT
SOL .9 NS 3000ML IRR UROMATIC (IV SOLUTION) ×1 IMPLANT
SOLUTION IRRIG SURGIPHOR (IV SOLUTION) ×1 IMPLANT
STEM TIB PERS SZ E 5D LT (Screw) IMPLANT
STEM TIBIAL SZ6-7 EF 12 LT (Knees) IMPLANT
STOCKINETTE IMPERV 14X48 (MISCELLANEOUS) ×1 IMPLANT
SUT STRATAFIX 14 PDO 36 VLT (SUTURE) ×1 IMPLANT
SUT STRATAFIX PDO 1 14 VIOLET (SUTURE) ×1 IMPLANT
SUT VIC AB 0 CT1 36 (SUTURE) ×1 IMPLANT
SUT VIC AB 2-0 CT2 27 (SUTURE) ×2 IMPLANT
SUTURE STRATA 1 CT-1 DLB (SUTURE) ×1 IMPLANT
SUTURE STRATA SPIR 4-0 18 (SUTURE) ×1 IMPLANT
SUTURE VICRYL 1-0 27IN ABS (SUTURE) ×1 IMPLANT
SYR 20ML LL LF (SYRINGE) ×2 IMPLANT
TAPE CLOTH 3X10 WHT NS LF (GAUZE/BANDAGES/DRESSINGS) ×1 IMPLANT
TIP FAN IRRIG PULSAVAC PLUS (DISPOSABLE) ×1 IMPLANT
TOWEL OR 17X26 4PK STRL BLUE (TOWEL DISPOSABLE) IMPLANT
TRAP FLUID SMOKE EVACUATOR (MISCELLANEOUS) ×1 IMPLANT
WATER STERILE IRR 1000ML POUR (IV SOLUTION) ×1 IMPLANT

## 2023-08-24 NOTE — Interval H&P Note (Signed)
 Patient history and physical updated. Consent reviewed including risks, benefits, and alternatives to surgery. Patient agrees with above plan to proceed with left total knee arthroplasty.

## 2023-08-24 NOTE — Transfer of Care (Signed)
 Immediate Anesthesia Transfer of Care Note  Patient: Tracey Jensen  Procedure(s) Performed: ARTHROPLASTY, KNEE, TOTAL (Left: Knee)  Patient Location: PACU  Anesthesia Type:Spinal  Level of Consciousness: awake  Airway & Oxygen Therapy: Patient Spontanous Breathing  Post-op Assessment: Report given to RN and Post -op Vital signs reviewed and stable  Post vital signs: Reviewed and stable  Last Vitals:  Vitals Value Taken Time  BP 109/60 08/24/23 1615  Temp    Pulse 64 08/24/23 1618  Resp 13 08/24/23 1618  SpO2 96 % 08/24/23 1618  Vitals shown include unfiled device data.  Last Pain:  Vitals:   08/24/23 1142  PainSc: 2          Complications: No notable events documented.

## 2023-08-24 NOTE — Anesthesia Procedure Notes (Signed)
 Spinal  Patient location during procedure: OR End time: 08/24/2023 2:17 PM Reason for block: surgical anesthesia Staffing Performed: anesthesiologist  Anesthesiologist: Enrique Harvest, MD Resident/CRNA: Juanda Noon, CRNA Performed by: Juanda Noon, CRNA Authorized by: Enrique Harvest, MD   Preanesthetic Checklist Completed: patient identified, IV checked, site marked, risks and benefits discussed, surgical consent, monitors and equipment checked, pre-op evaluation and timeout performed Spinal Block Patient position: sitting Prep: Betadine Patient monitoring: heart rate, continuous pulse ox, blood pressure and cardiac monitor Approach: midline Location: L4-5 Injection technique: single-shot Needle Needle type: Introducer and Whitacre  Needle gauge: 22 G Needle length: 9 cm Assessment Sensory level: T4 Events: CSF return Additional Notes Negative paresthesia. Negative blood return. Positive free-flowing CSF. Expiration date of kit checked and confirmed. Patient tolerated procedure well, without complications.

## 2023-08-24 NOTE — H&P (Signed)
 History of Present Illness: Tracey Jensen is an 73 y.o. female presents for history and physical for left total knee arthroplasty with Dr. Clyda Dark on 08/24/2023. Patient has advanced left knee osteoarthritis with complete loss of joint space in the medial compartment with varus alignment. She has severe patellofemoral osteoarthritis. She has had no relief with conservative treatment. Her pain is interfering with her quality of life and activities a living. Risks, benefits, complications of a left total knee arthroplasty have been discussed with the patient patient has agreed and consented the procedure with Dr. Clyda Dark on 08/24/2023. Patient's pain is located along the medial joint line left knee. Unable to take NSAIDs. No reports of diabetes, DVTs, smoking.  Patient has a BMI of 34.5  Past Medical History: Past Medical History:  Diagnosis Date  Bariatric surgery status 10/03/2014  Chronic constipation 10/03/2014  History of thyroid  cancer 02/2017  Diagnosed 02/2017, status post partial thyroidectomy but no HRT or chemotherapy  Iron deficiency anemia due to dietary causes 10/03/2014  Overview: Due to bariatric surgery has multiple vitamin absorption issues.  Living will on file at physician's office 05/14/2023  Obesity (BMI 30.0-34.9)  s/p bariatric surgery  Post-menopause on HRT (hormone replacement therapy) 10/03/2014  S/P TAH-BSO (total abdominal hysterectomy and bilateral salpingo-oophorectomy) 10/03/2014   Past Surgical History: Past Surgical History:  Procedure Laterality Date  CHOLECYSTECTOMY 09/1996  ROUX-EN-Y PROCEDURE 09/2012  HYSTERECTOMY 12/2013  BSO  BRACHIOPLASTY 06/2015  MASTOPEXY BILATERAL 06/2015  TRANSUMBILICAL AUGMENTATION MAMMAPLASTY 09/2015  BLEPHAROPLASTY Bilateral 04/2016  Partial parathyroidectomy Left 03/20/2017  Thyroidectomy Right 03/20/2017   Past Family History: Family History  Problem Relation Age of Onset  Colon polyps Mother  Hyperlipidemia  (Elevated cholesterol) Mother  High blood pressure (Hypertension) Mother  Osteoarthritis Mother  Prostate cancer Father  Skin cancer Father  Hyperlipidemia (Elevated cholesterol) Father  Colon cancer Father  Thyroid  disease Sister  Diabetes type II Sister  Hyperlipidemia (Elevated cholesterol) Sister  Osteoarthritis Maternal Grandmother  Stroke Maternal Grandmother  Colon cancer Paternal Grandmother  Hyperlipidemia (Elevated cholesterol) Paternal Grandmother  Hyperlipidemia (Elevated cholesterol) Paternal Grandfather  High blood pressure (Hypertension) Paternal Grandfather  Stroke Paternal Grandfather   Medications: Current Outpatient Medications  Medication Sig Dispense Refill  acetaminophen  (TYLENOL ) 500 MG tablet Take 1,000 mg by mouth every 6 (six) hours  atorvastatin (LIPITOR) 10 MG tablet Take 1 tablet (10 mg total) by mouth once daily 90 tablet 3  CALCIUM ORAL Take 2 tablets by mouth once daily  cholecalciferol (VITAMIN D3) 1,000 unit capsule Take 3,000 Units by mouth once daily.   cyanocobalamin (VITAMIN B12) 1000 MCG tablet Take 1,000 mcg by mouth once daily  cyanocobalamin (VITAMIN B12) 500 MCG tablet Take 250 mcg by mouth every other day  estradiol  (ESTRACE ) 1 MG tablet Take 1 mg by mouth once daily  levothyroxine (SYNTHROID) 100 MCG tablet Take 1 tablet (100 mcg total) by mouth once daily Take on an empty stomach with a glass of water  at least 30-60 minutes before breakfast. 90 tablet 3  multivitamin capsule Take 1 capsule by mouth once daily Spectra  vit   No current facility-administered medications for this visit.   Allergies: No Known Allergies   Visit Vitals: Vitals:  08/07/23 1021  BP: 128/88    Review of Systems:  A comprehensive 14 point ROS was performed, reviewed, and the pertinent orthopaedic findings are documented in the HPI.  Physical Exam: Body mass index is 35.84 kg/m. Vitals:  08/07/23 1021  BP: 128/88   General:  Well developed, well  nourished, no apparent distress, normal affect, antalgic gait with no assistive devices.  HEENT: Head normocephalic, atraumatic, PERRL.   Abdomen: Soft, non tender, non distended, Bowel sounds present.  Heart: Examination of the heart reveals regular, rate, and rhythm. There is no murmur noted on ascultation. There is a normal apical pulse.  Lungs: Lungs are clear to auscultation. There is no wheeze, rhonchi, or crackles. There is normal expansion of bilateral chest walls.   Left knee exam Skin intact over the knee Tender to palpation over the medial joint line Range of motion 5 to100 with crepitus through range of motion the patellofemoral joint and tibiofemoral joint 5 degree flexion contracture not correctable Neurovascular intact to soft compartments Able to dorsiflex and plantarflex the foot and toes with intact dorsalis pedis pulse Stable to testing to varus and valgus stress anterior drawer in flexion stable  Imaging :   Three-view x-rays of the left knee AP lateral and sunrise taken on 05/22/2023 images reviewed by myself. There is severe tricompartmental degenerative changes with medial patellofemoral bone-on-bone articulation, osteophyte formation, sclerosis, and subchondral cyst formation. Kellgren-Lawrence grade 4. No fractures or dislocations noted.  Assessment:  Severe left knee arthritis  Plan: Tracey Jensen is a 73 year old female who presents today for history and physical for left total knee replacement with Dr. Clyda Dark on 08/24/2023. She has advanced left knee degenerative arthritis with complete loss of joint space in the medial compartment left knee. She has had diminishing results with conservative treatment. Pain interferes with her quality of life and activities daily living. Risks, benefits, complications of a left total knee arthroplasty have been discussed with the patient patient has agreed to consent procedure with Dr. Clyda Dark on 08/24/2023.  The hospitalization and  post-operative care and rehabilitation were also discussed. The use of perioperative antibiotics and DVT prophylaxis were discussed. The risk, benefits and alternatives to a surgical intervention were discussed at length with the patient. The patient was also advised of risks related to the medical comorbidities and elevated body mass index (BMI). A lengthy discussion took place to review the most common complications including but not limited to: stiffness, loss of function, complex regional pain syndrome, deep vein thrombosis, pulmonary embolus, heart attack, stroke, infection, wound breakdown, numbness, intraoperative fracture, damage to nerves, tendon,muscles, arteries or other blood vessels, death and other possible complications from anesthesia. The patient was told that we will take steps to minimize these risks by using sterile technique, antibiotics and DVT prophylaxis when appropriate and follow the patient postoperatively in the office setting to monitor progress. The possibility of recurrent pain, no improvement in pain and actual worsening of pain were also discussed with the patient.   All questions answered patient agrees with above plan for left total knee arthroplasty.

## 2023-08-24 NOTE — Op Note (Signed)
 Patient Name: Tracey Jensen  ZOX:[id number]  Pre-Operative Diagnosis: Left knee Osteoarthritis  Post-Operative Diagnosis: (same)  Procedure: Left Total Knee Arthroplasty  Components/Implants: Femur: Persona Size 7 CR PPS   Tibia: Persona Size E OsseoTi  Poly: 12mm MC  Patella: 35x9.57mm symmetric osseoti  Femoral Valgus Cut Angle: 5 degrees  Distal Femoral Re-cut: none  Patella Resurfacing: yes   Date of Surgery: 08/24/2023  Surgeon: Venus Ginsberg MD  Assistant: Francenia Ingle PA (present and scrubbed throughout the case, critical for assistance with exposure, retraction, instrumentation, and closure)   Anesthesiologist: Duayne Gey  Anesthesia: Spinal   Tourniquet Time: 49 min  EBL: 50cc  IVF: 700cc  Complications: None   Brief history: The patient is a 73 year old female with a history of osteoarthritis of the left knee with pain limiting their range of motion and activities of daily living, which has failed multiple attempts at conservative therapy.  The risks and benefits of total knee arthroplasty as definitive surgical treatment were discussed with the patient, who opted to proceed with the operation.  After outpatient medical clearance and optimization was completed the patient was admitted to [facility] for the procedure.  All preoperative films were reviewed and an appropriate surgical plan was made prior to surgery. Preoperative range of motion was 5 to 100 with a 5 degree flexion contracture. The patient was identified as having a varus alignment.   Description of procedure: The patient was brought to the operating room where laterality was confirmed by all those present to be the left side.   Spinal anesthesia was administered and the patient received an intravenous dose of antibiotics for surgical prophylaxis and a dose of tranexamic acid.  Patient is positioned supine on the operating room table with all bony prominences well-padded.  A  well-padded tourniquet was applied to the left thigh.  The knee was then prepped and draped in usual sterile fashion with multiple layers of adhesive and nonadhesive drapes.  All of those present in the operating room participated in a surgical timeout laterality and patient were confirmed.   An Esmarch was wrapped around the extremity and the leg was elevated and the knee flexed.  The tourniquet was inflated to a pressure of 275 mmHg. The Esmarch was removed and the leg was brought down to full extension.  The patella and tibial tubercle identified and outlined using a marking pen and a midline skin incision was made with a knife carried through the subcutaneous tissue down to the extensor retinaculum.  After exposure of the extensor mechanism the medial parapatellar arthrotomy was performed with a scalpel and electrocautery extending down medial and distal to the tibial tubercle taking care to avoid incising the patellar tendon.   A standard medial release was performed over the proximal tibia.  The knee was brought into extension in order to excise the fat pad taking care not to damage the patella tendon.  The superior soft tissue was removed from the anterior surface of the distal femur to visualize for the procedure.  The knee was then brought into flexion with the patella subluxed laterally and subluxing the tibia anteriorly.  The ACL was transected and removed with electrocautery and additional soft tissue was removed from the proximal surface of the tibia to fully expose. The PCL was found to be intact and was preserved.  An extramedullary tibial cutting guide was then applied to the leg with a spring-loaded ankle clamp placed around the distal tibia just above the malleoli the angulation of  the guide was adjusted to give some posterior slope in the tibial resection with an appropriate varus/valgus alignment.  The resection guide was then pinned to the proximal tibia and the proximal tibial surface was  resected with an oscillating saw.  Careful attention was paid to ensure the blade did not disrupt any of the soft tissues including any lateral or medial ligament.  Attention was then turned to the femur, with the knee slightly flexed a opening drill was used to enter the medullary canal of the femur.  After removing the drill marrow was suctioned out to decompress the distal femur.  An intramedullary femoral guide was then inserted into the drill hole and the alignment guide was seated firmly against the distal end of the medial femoral condyle.  The distal femoral cutting guide was then attached and pinned securely to the anterior surface of the femur and the intramedullary rod and alignment guide was removed.  Distal femur resection was then performed with an oscillating saw with retractors protecting medial and laterally.   The distal cutting block was then removed and the extension gap was checked with a spacer.  Extension gap was found to be appropriately sized to accommodate the spacer block.   The femoral sizing guide was then placed securely into the posterior condyles of the femur and the femoral size was measured and determined to be 7.  The size 7; 4-in-1 cutting guide was placed in position and secured with 2 pins.  The anterior posterior and chamfer resections were then performed with an oscillating saw.  Bony fragments and osteophytes were then removed.  Using a lamina spreader the posterior medial and lateral condyles were checked for additional osteophytes and posterior soft tissue remnants.  Any remaining meniscus was removed at this time.  Periarticular injection was performed in the meniscal rims and posterior capsule with aspiration performed to ensure no intravascular injection.   The tibia was then exposed and the tibial trial was pinned onto the plateau after confirming appropriate orientation and rotation.  Using the drill bushing the tibia was prepared to the appropriate drill  depth.  Tibial broach impactor was then driven through the punch guide using a mallet.  The femoral trial component was then inserted onto the femur. A trial tibial polyethylene bearing was then placed and the knee was reduced.  The knee achieved full extension with no hyperextension and was found to be balanced in flexion and extension with the trials in place.  The knee was then brought into full extension the patella was everted and held with 2 Kocher clamps.  The articular surface of the patella was then resected with an patella reamer and saw after careful measurement with a caliper.  The patella was then prepared with the drill guide and a trial patella was placed.  The knee was then taken through range of motion and it was found that the patella articulated appropriately with the trochlea and good patellofemoral motion without subluxation.    The correct final components for implantation were confirmed and opened by the circulator nurse.  The knee was irrigated with normal saline via pulsatile lavage to remove any bony debris or soft tissue.  The prepared surface of the tibia was exposed and the tibial component was implanted with good bony contact.  The femoral component was then placed and impacted showing good coverage and a good snug fit.  The patella was then cleared off and the patella compression tool was used to apply the patellar component  with symmetric compression onto the patella.  The tibial component was then irrigated and cleared of any debris and a real polyethylene component was placed and engaged with the locking mechanism.  The knee was then injected with the particular cocktail.  The knee was taken through range of motion and found to be stable in flexion and extension with patellar tracking.  The knee was then irrigated with copious amount of normal saline via pulsatile lavage to remove all loose bodies and other debris.  The knee was then irrigated with surgiphor betadine based wash  and reirrigated with saline.  The tourniquet was then dropped and all bleeding vessels were identified and coagulated.  The arthrotomy was approximated with #1 Vicryl and closed with #1 Stratafix suture.  The knee was brought into slight flexion and the subcutaneous tissues were closed with 0 Vicryl, 2-0 Vicryl and a running subcuticular 4-0 stratafix barbed suture.  Skin was then glued with Dermabond.  A sterile adhesive dressing was then placed along with a sequential compression device to the calf, a Ted stocking, and a cryotherapy cuff.   Sponge, needle, and Lap counts were all correct at the end of the case.   The patient was transferred off of the operating room table to a hospital bed, good pulses were found distally on the operative side.  The patient was transferred to the recovery room in stable condition.

## 2023-08-24 NOTE — Anesthesia Preprocedure Evaluation (Signed)
 Anesthesia Evaluation  Patient identified by MRN, date of birth, ID band Patient awake    Reviewed: Allergy & Precautions, NPO status , Patient's Chart, lab work & pertinent test results  History of Anesthesia Complications (+) PONV and history of anesthetic complications  Airway Mallampati: II  TM Distance: >3 FB Neck ROM: full    Dental  (+) Chipped, Dental Advidsory Given   Pulmonary neg pulmonary ROS, former smoker   Pulmonary exam normal        Cardiovascular negative cardio ROS Normal cardiovascular exam     Neuro/Psych negative neurological ROS  negative psych ROS   GI/Hepatic negative GI ROS, Neg liver ROS,,,  Endo/Other  Hypothyroidism    Renal/GU      Musculoskeletal   Abdominal   Peds  Hematology negative hematology ROS (+)   Anesthesia Other Findings Past Medical History: No date: Blood transfusion without reported diagnosis 2018: History of thyroid  cancer 10/03/2014: Iron deficiency anemia due to dietary causes No date: Osteoarthritis of both knees No date: Pneumonia     Comment:  as a child No date: PONV (postoperative nausea and vomiting)     Comment:  nausea - many years ago No date: Postoperative hypothyroidism No date: Sleep apnea     Comment:  history of prior to surgery No date: Thyroid  disease No date: Wears hearing aid in both ears  Past Surgical History: 12/2014: ABDOMINOPLASTY 09/2015: AUGMENTATION MAMMAPLASTY; Bilateral     Comment:  mastopexy and silicone implants No date: BELPHAROPTOSIS REPAIR 06/2015: BRACHIOPLASTY No date: BREAST CYST ASPIRATION; Right 06/2015: BREAST ENHANCEMENT SURGERY     Comment:  breast lift 03/23/2023: CATARACT EXTRACTION W/PHACO; Left     Comment:  Procedure: CATARACT EXTRACTION PHACO AND INTRAOCULAR               LENS PLACEMENT (IOC) LEFT  3.03  00:32.7;  Surgeon: Rosa College, MD;  Location: [facility];                 Service: Ophthalmology;  Laterality: Left; 04/06/2023: CATARACT EXTRACTION W/PHACO; Right     Comment:  Procedure: CATARACT EXTRACTION PHACO AND INTRAOCULAR               LENS PLACEMENT (IOC) RIGHT 3.44 00:34.9;  Surgeon: Rosa College, MD;  Location: [facility];                Service: Ophthalmology;  Laterality: Right; 11/08/2015: COLONOSCOPY WITH PROPOFOL ; N/A     Comment:  Procedure: COLONOSCOPY WITH PROPOFOL ;  Surgeon: Marnee Sink, MD;  Location: [facility];  Service:               Endoscopy;  Laterality: N/A; 09/1996: LAPAROSCOPIC CHOLECYSTECTOMY 03/20/2017: PARATHYROIDECTOMY; 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 03/20/2017: THYROID  LOBECTOMY; Right 12/2013: TOTAL VAGINAL HYSTERECTOMY     Comment:  with bilateral oophorectomy  BMI    Body Mass Index: 35.08 kg/m      Reproductive/Obstetrics negative OB ROS                             Anesthesia Physical Anesthesia Plan  ASA: 2  Anesthesia Plan: Spinal   Post-op Pain Management:    Induction:  PONV Risk Score and Plan: 3 and Ondansetron , Dexamethasone, Propofol  infusion, TIVA and Midazolam   Airway Management Planned: Natural Airway and Nasal Cannula  Additional Equipment:   Intra-op Plan:   Post-operative Plan:   Informed Consent: I have reviewed the patients History and Physical, chart, labs and discussed the procedure including the risks, benefits and alternatives for the proposed anesthesia with the patient or authorized representative who has indicated his/her understanding and acceptance.     Dental Advisory Given  Plan Discussed with: Anesthesiologist, CRNA and Surgeon  Anesthesia Plan Comments: (Patient reports no bleeding problems and no anticoagulant use.  Plan for spinal with backup GA  Patient consented for risks of anesthesia including but not limited to:  - adverse reactions to medications - damage to eyes,  teeth, lips or other oral mucosa - nerve damage due to positioning  - risk of bleeding, infection and or nerve damage from spinal that could lead to paralysis - risk of headache or failed spinal - damage to teeth, lips or other oral mucosa - sore throat or hoarseness - damage to heart, brain, nerves, lungs, other parts of body or loss of life  Patient voiced understanding and assent.)       Anesthesia Quick Evaluation

## 2023-08-25 ENCOUNTER — Encounter: Payer: Self-pay | Admitting: Orthopedic Surgery

## 2023-08-25 DIAGNOSIS — M1712 Unilateral primary osteoarthritis, left knee: Secondary | ICD-10-CM | POA: Diagnosis not present

## 2023-08-25 LAB — CBC
HCT: 31.9 % — ABNORMAL LOW (ref 36.0–46.0)
Hemoglobin: 11 g/dL — ABNORMAL LOW (ref 12.0–15.0)
MCH: 31.9 pg (ref 26.0–34.0)
MCHC: 34.5 g/dL (ref 30.0–36.0)
MCV: 92.5 fL (ref 80.0–100.0)
Platelets: 207 10*3/uL (ref 150–400)
RBC: 3.45 MIL/uL — ABNORMAL LOW (ref 3.87–5.11)
RDW: 13.1 % (ref 11.5–15.5)
WBC: 10.1 10*3/uL (ref 4.0–10.5)
nRBC: 0 % (ref 0.0–0.2)

## 2023-08-25 LAB — BASIC METABOLIC PANEL WITH GFR
Anion gap: 4 — ABNORMAL LOW (ref 5–15)
BUN: 17 mg/dL (ref 8–23)
CO2: 23 mmol/L (ref 22–32)
Calcium: 8.2 mg/dL — ABNORMAL LOW (ref 8.9–10.3)
Chloride: 108 mmol/L (ref 98–111)
Creatinine, Ser: 0.75 mg/dL (ref 0.44–1.00)
GFR, Estimated: >60 mL/min (ref >60)
Glucose, Bld: 112 mg/dL — ABNORMAL HIGH (ref 70–99)
Potassium: 4.1 mmol/L (ref 3.5–5.1)
Sodium: 135 mmol/L (ref 135–145)

## 2023-08-25 MED ORDER — TRAMADOL HCL 50 MG PO TABS: 50.0000 mg | ORAL_TABLET | Freq: Four times a day (QID) | ORAL | 0 refills | Status: DC | PRN

## 2023-08-25 MED ORDER — ACETAMINOPHEN 500 MG PO TABS: 1000.0000 mg | ORAL_TABLET | Freq: Three times a day (TID) | ORAL | 0 refills | Status: DC

## 2023-08-25 MED ORDER — ENOXAPARIN SODIUM 40 MG/0.4ML IJ SOSY: 40.0000 mg | PREFILLED_SYRINGE | INTRAMUSCULAR | 0 refills | Status: DC

## 2023-08-25 MED ORDER — OXYCODONE HCL 5 MG PO TABS: 2.5000 mg | ORAL_TABLET | ORAL | 0 refills | Status: DC | PRN

## 2023-08-25 MED ORDER — ONDANSETRON HCL 4 MG PO TABS: 4.0000 mg | ORAL_TABLET | Freq: Four times a day (QID) | ORAL | 0 refills | Status: DC | PRN

## 2023-08-25 MED ORDER — CELECOXIB 200 MG PO CAPS: 200.0000 mg | ORAL_CAPSULE | Freq: Two times a day (BID) | ORAL | 0 refills | Status: AC

## 2023-08-25 NOTE — Plan of Care (Signed)

## 2023-08-25 NOTE — Progress Notes (Signed)
 DISCHARGE NOTE:   Pt dc with IV removed. Pt has both TED hose on and in place., Pt given dc instructions and education on Lovenox injections. Pt had RW delivered to hospital room. Pt wheeled down to medical mall entrance by staff and pt's husband provided transportation.

## 2023-08-25 NOTE — Discharge Summary (Signed)
 Physician Discharge Summary  Patient ID: Tracey Jensen MRN: [id number] DOB/AGE: May 06, 1950 73 y.o.  Admit date: 08/24/2023 Discharge date: 08/25/2023  Admission Diagnoses:  Primary osteoarthritis of left knee [M17.12] S/P TKR (total knee replacement), left [Z96.652]   Discharge Diagnoses: Patient Active Problem List   Diagnosis Date Noted   S/P TKR (total knee replacement), left 08/24/2023   Special screening for malignant neoplasms, colon    Chronic constipation 10/03/2014   Post-menopause on HRT (hormone replacement therapy) 10/03/2014   S/P TAH-BSO (total abdominal hysterectomy and bilateral salpingo-oophorectomy) 10/03/2014   Bariatric surgery status 10/03/2014   Iron deficiency anemia due to dietary causes 10/03/2014    Past Medical History:  Diagnosis Date   Blood transfusion without reported diagnosis    History of thyroid  cancer 2018   Iron deficiency anemia due to dietary causes 10/03/2014   Osteoarthritis of both knees    Pneumonia    as a child   PONV (postoperative nausea and vomiting)    nausea - many years ago   Postoperative hypothyroidism    Sleep apnea    history of prior to surgery   Thyroid  disease    Wears hearing aid in both ears      Transfusion: none   Consultants (if any):   Discharged Condition: Improved  Hospital Course: Tracey Jensen is an 73 y.o. female who was admitted 08/24/2023 with a diagnosis of S/P TKR (total knee replacement), left and went to the operating room on 08/24/2023 and underwent the above named procedures.    Surgeries: Procedure(s): ARTHROPLASTY, KNEE, TOTAL on 08/24/2023 Patient tolerated the surgery well. Taken to PACU where she was stabilized and then transferred to the orthopedic floor.  Started on Lovenox 30 mg q 12 hrs. TEDs and SCDs applied bilaterally. Heels elevated on bed. No evidence of DVT. Negative Homan. Physical therapy started on day #1 for gait training and transfer. OT started day #1 for ADL and assisted  devices.  Patient's IV was d/c on day #1. Patient was able to safely and independently complete all PT goals. PT recommending discharge to home.    On post op day #1 patient was stable and ready for discharge to home with HHPT.  Implants: : Femur: Persona Size 7 CR PPS   Tibia: Persona Size E OsseoTi  Poly: 12mm MC  Patella: 35x9.69mm symmetric osseoti   She was given perioperative antibiotics:  Anti-infectives (From admission, onward)    Start     Dose/Rate Route Frequency Ordered Stop   08/24/23 2000  ceFAZolin (ANCEF) IVPB 2g/100 mL premix        2 g 200 mL/hr over 30 Minutes Intravenous Every 6 hours 08/24/23 1807 08/25/23 0245   08/24/23 0600  ceFAZolin (ANCEF) IVPB 2g/100 mL premix        2 g 200 mL/hr over 30 Minutes Intravenous On call to O.R. 08/23/23 2219 08/24/23 1407     .  She was given sequential compression devices, early ambulation, and Lovenox TEDs for DVT prophylaxis.  She benefited maximally from the hospital stay and there were no complications.    Recent vital signs:  Vitals:   08/25/23 0422 08/25/23 0933  BP: (!) 107/58 (!) 102/54  Pulse: 65 75  Resp: 18 15  Temp: 98.4 F (36.9 C) 98.2 F (36.8 C)  SpO2: 94% 94%    Recent laboratory studies:  Lab Results  Component Value Date   HGB 11.0 (L) 08/25/2023   HGB 12.4 08/11/2023   HGB 12.0 04/19/2014  Lab Results  Component Value Date   WBC 10.1 08/25/2023   PLT 207 08/25/2023   Lab Results  Component Value Date   INR 1.0 07/07/2012   Lab Results  Component Value Date   NA 135 08/25/2023   K 4.1 08/25/2023   CL 108 08/25/2023   CO2 23 08/25/2023   BUN 17 08/25/2023   CREATININE 0.75 08/25/2023   GLUCOSE 112 (H) 08/25/2023    Discharge Medications:   Allergies as of 08/25/2023   No Known Allergies      Medication List     TAKE these medications    acetaminophen  500 MG tablet Commonly known as: TYLENOL  Take 1,000 mg by mouth every 6 (six) hours as needed for moderate pain  (pain score 4-6). What changed: Another medication with the same name was added. Make sure you understand how and when to take each.   acetaminophen  500 MG tablet Commonly known as: TYLENOL  Take 2 tablets (1,000 mg total) by mouth every 8 (eight) hours. What changed: You were already taking a medication with the same name, and this prescription was added. Make sure you understand how and when to take each.   atorvastatin 10 MG tablet Commonly known as: LIPITOR Take 10 mg by mouth every evening.   CALCIUM 500 +D PO Take 1 tablet by mouth daily.   celecoxib 200 MG capsule Commonly known as: CeleBREX Take 1 capsule (200 mg total) by mouth 2 (two) times daily for 10 days.   cholecalciferol 25 MCG (1000 UNIT) tablet Commonly known as: VITAMIN D3 Take 1,000 Units by mouth at bedtime.   cyanocobalamin 1000 MCG tablet Commonly known as: VITAMIN B12 Take 1,000 mcg by mouth at bedtime.   enoxaparin 40 MG/0.4ML injection Commonly known as: LOVENOX Inject 0.4 mLs (40 mg total) into the skin daily for 14 days.   estradiol  1 MG tablet Commonly known as: ESTRACE  Take 1 tablet (1 mg total) by mouth daily. What changed: when to take this   levothyroxine 100 MCG tablet Commonly known as: SYNTHROID Take 100 mcg by mouth daily before breakfast.   multivitamin capsule Take 1 capsule by mouth at bedtime.   ondansetron  4 MG tablet Commonly known as: ZOFRAN  Take 1 tablet (4 mg total) by mouth every 6 (six) hours as needed for nausea.   oxyCODONE 5 MG immediate release tablet Commonly known as: Roxicodone Take 0.5-1 tablets (2.5-5 mg total) by mouth every 4 (four) hours as needed for severe pain (pain score 7-10).   traMADol 50 MG tablet Commonly known as: ULTRAM Take 1 tablet (50 mg total) by mouth every 6 (six) hours as needed for moderate pain (pain score 4-6).        Diagnostic Studies: DG Knee 1-2 Views Left Result Date: 08/24/2023 CLINICAL DATA:  Elective surgery, status post  left knee replacement. EXAM: LEFT KNEE - 1-2 VIEW COMPARISON:  None Available. FINDINGS: Left knee arthroplasty in expected alignment. No periprosthetic lucency or fracture. Recent postsurgical change includes air and edema in the soft tissues and joint space. IMPRESSION: Left knee arthroplasty without immediate postoperative complication. Electronically Signed   By: Chadwick Colonel M.D.   On: 08/24/2023 16:50    Disposition:      Follow-up Information     Coralyn Derry, PA-C Follow up in 2 week(s).   Specialties: Orthopedic Surgery, Emergency Medicine Contact information: [address] [place] [address] [phone]  Signed: Coralyn Derry 08/25/2023, 10:10 AM

## 2023-08-25 NOTE — Anesthesia Postprocedure Evaluation (Signed)
 Anesthesia Post Note  Patient: Tracey Jensen  Procedure(s) Performed: ARTHROPLASTY, KNEE, TOTAL (Left: Knee)  Patient location during evaluation: Nursing Unit Anesthesia Type: Spinal Level of consciousness: awake Pain management: pain level controlled Respiratory status: spontaneous breathing Cardiovascular status: stable Postop Assessment: no headache Anesthetic complications: no   No notable events documented.   Last Vitals:  Vitals:   08/24/23 2036 08/25/23 0422  BP: 109/62 (!) 107/58  Pulse: 66 65  Resp: 18 18  Temp: 36.6 C 36.9 C  SpO2: 96% 94%    Last Pain:  Vitals:   08/25/23 0553  TempSrc:   PainSc: 2                  Curvin Downing

## 2023-08-25 NOTE — TOC Transition Note (Addendum)
 Transition of Care [facility]) - Discharge Note   Patient Details  Name: Tracey Jensen MRN: [id number] Date of Birth: [birth year]  [facility]) CM/SW Contact:  Alexandra Ice, RN Phone Number: 08/25/2023, 10:32 AM   Clinical Narrative:    Patient lives with spouse, in single level home. Patient to discharge home with home health. Needs RW, sent referral to Jon with [employer] for processing. [facility] set up with surgeon's office prior to surgery. Shaun with [facility] notified patient to discharge today. Family to transport patient home at discharge.    Final next level of care: Home w Home Health Services Barriers to Discharge: Barriers Resolved   Patient Goals and CMS Choice Patient states their goals for this hospitalization and ongoing recovery are:: get better          Discharge Placement                  Name of family member notified: Mahniya Sabas, spouse Patient and family notified of of transfer: 08/25/23  Discharge Plan and Services Additional resources added to the After Visit Summary for                  DME Arranged: Walker rolling DME Agency: [employer] Date DME Agency Contacted: 08/25/23 Time DME Agency Contacted: 1030 Representative spoke with at DME Agency: Sam Creighton HH Arranged: PT, OT HH Agency: [facility] Date HH Agency Contacted: 08/25/23 Time HH Agency Contacted: 1031 Representative spoke with at [facility] Agency: Shaun  Social Drivers of Health (SDOH) Interventions SDOH Screenings   Food Insecurity: No Food Insecurity (08/24/2023)  Housing: Low Risk  (08/24/2023)  Transportation Needs: No Transportation Needs (08/24/2023)  Utilities: Not At Risk (08/24/2023)  Depression (PHQ2-9): Low Risk  (12/05/2020)  Financial Resource Strain: Low Risk  (08/07/2023)   Received from [facility]  Physical Activity: Insufficiently Active (04/08/2021)   Received from [facility]  Social Connections:  Socially Integrated (08/24/2023)  Stress: No Stress Concern Present (04/08/2021)   Received from [facility]  Tobacco Use: Medium Risk (08/24/2023)     Readmission Risk Interventions     No data to display

## 2023-08-25 NOTE — Progress Notes (Signed)
   Subjective: 1 Day Post-Op Procedure(s) (LRB): ARTHROPLASTY, KNEE, TOTAL (Left) Patient reports pain as mild.   Patient is well, and has had no acute complaints or problems Denies any CP, SOB, ABD pain. We will continue therapy today.  Plan is to go Home after hospital stay.  Objective: Vital signs in last 24 hours: Temp:  [97.5 F (36.4 C)-98.4 F (36.9 C)] 98.4 F (36.9 C) (04/29 0422) Pulse Rate:  [56-83] 65 (04/29 0422) Resp:  [12-20] 18 (04/29 0422) BP: (107-134)/(53-82) 107/58 (04/29 0422) SpO2:  [94 %-98 %] 94 % (04/29 0422) Weight:  [101.6 kg] 101.6 kg (04/28 1142)  Intake/Output from previous day: 04/28 0701 - 04/29 0700 In: 1860 [P.O.:460; I.V.:800; IV Piggyback:600] Out: 1250 [Urine:1200; Blood:50] Intake/Output this shift: No intake/output data recorded.  Recent Labs    08/25/23 0629  HGB 11.0*   Recent Labs    08/25/23 0629  WBC 10.1  RBC 3.45*  HCT 31.9*  PLT 207   Recent Labs    08/25/23 0629  NA 135  K 4.1  CL 108  CO2 23  BUN 17  CREATININE 0.75  GLUCOSE 112*  CALCIUM 8.2*   No results for input(s): "LABPT", "INR" in the last 72 hours.  EXAM General - Patient is Alert, Appropriate, and Oriented Extremity - Neurovascular intact Sensation intact distally Intact pulses distally Dorsiflexion/Plantar flexion intact Dressing - dressing C/D/I and no drainage Motor Function - intact, moving foot and toes well on exam.   Past Medical History:  Diagnosis Date   Blood transfusion without reported diagnosis    History of thyroid  cancer 2018   Iron deficiency anemia due to dietary causes 10/03/2014   Osteoarthritis of both knees    Pneumonia    as a child   PONV (postoperative nausea and vomiting)    nausea - many years ago   Postoperative hypothyroidism    Sleep apnea    history of prior to surgery   Thyroid  disease    Wears hearing aid in both ears     Assessment/Plan:   1 Day Post-Op Procedure(s) (LRB): ARTHROPLASTY, KNEE,  TOTAL (Left) Principal Problem:   S/P TKR (total knee replacement), left  Estimated body mass index is 35.08 kg/m as calculated from the following:   Height as of this encounter: 5\' 7"  (1.702 m).   Weight as of this encounter: 101.6 kg. Advance diet Up with therapy Pain well controlled Labs and VSS CM to assist with discharge to home with HHPT today  DVT Prophylaxis - Lovenox, TED hose, and SCDs Weight-Bearing as tolerated to left leg   T. Thomos Flies, PA-C [facility] 08/25/2023, 8:53 AM

## 2023-08-25 NOTE — Evaluation (Signed)
 Physical Therapy Evaluation Patient Details Name: Tracey Jensen MRN: [id number] DOB: Oct 18, 1950 Today's Date: 08/25/2023  History of Present Illness  73 y/o female s/p L TKA on 08/24/23. No significant PMH.  Clinical Impression  Patient admitted following the above procedure. PTA, patient lives with husband and reports independence with mobility with no AD. Patient functioning at supervision level with RW for ambulation and stair negotiation. Complaining of minimal pain during session. Educated patient on LLE positioning and exercise program provided, patient returned demonstration. Patient will benefit from skilled PT services during acute stay to address listed deficits. Patient will benefit from ongoing therapy at discharge to maximize functional independence and safety.         If plan is discharge home, recommend the following: A little help with bathing/dressing/bathroom;Assistance with cooking/housework;Assist for transportation;Help with stairs or ramp for entrance   Can travel by private vehicle        Equipment Recommendations Rolling Kaylah Chiasson (2 wheels);BSC/3in1  Recommendations for Other Services       Functional Status Assessment Patient has had a recent decline in their functional status and demonstrates the ability to make significant improvements in function in a reasonable and predictable amount of time.     Precautions / Restrictions Precautions Precautions: Knee Precaution Booklet Issued: Yes (comment) Restrictions Weight Bearing Restrictions Per Provider Order: Yes LLE Weight Bearing Per Provider Order: Weight bearing as tolerated      Mobility  Bed Mobility Overal bed mobility: Modified Independent                  Transfers Overall transfer level: Needs assistance Equipment used: Rolling Ashli Selders (2 wheels) Transfers: Sit to/from Stand Sit to Stand: Supervision                Ambulation/Gait Ambulation/Gait assistance: Supervision Gait  Distance (Feet): 200 Feet Assistive device: Rolling Jeanette Moffatt (2 wheels) Gait Pattern/deviations: Decreased stride length, Step-to pattern Gait velocity: decreased     General Gait Details: cues for heel strike on L for knee extension  Stairs Stairs: Yes Stairs assistance: Supervision Stair Management: Two rails, Step to pattern, Forwards Number of Stairs: 2    Wheelchair Mobility     Tilt Bed    Modified Rankin (Stroke Patients Only)       Balance Overall balance assessment: Needs assistance Sitting-balance support: No upper extremity supported, Feet supported Sitting balance-Leahy Scale: Good     Standing balance support: Bilateral upper extremity supported, Reliant on assistive device for balance Standing balance-Leahy Scale: Fair                               Pertinent Vitals/Pain Pain Assessment Pain Assessment: No/denies pain    Home Living Family/patient expects to be discharged to:: Private residence Living Arrangements: Spouse/significant other Available Help at Discharge: Family;Available 24 hours/day Type of Home: House Home Access: Stairs to enter Entrance Stairs-Rails: None Entrance Stairs-Number of Steps: 1   Home Layout: One level Home Equipment: None      Prior Function Prior Level of Function : Independent/Modified Independent;Driving                     Extremity/Trunk Assessment   Upper Extremity Assessment Upper Extremity Assessment: Overall WFL for tasks assessed    Lower Extremity Assessment Lower Extremity Assessment: LLE deficits/detail LLE Deficits / Details: L knee flexion ~70 degrees with limited assessment due to ACE wrap    Cervical /  Trunk Assessment Cervical / Trunk Assessment: Normal  Communication   Communication Communication: No apparent difficulties    Cognition Arousal: Alert Behavior During Therapy: WFL for tasks assessed/performed   PT - Cognitive impairments: No apparent impairments                          Following commands: Intact       Cueing       General Comments      Exercises Other Exercises Other Exercises: HEP handout provided and reviewed   Assessment/Plan    PT Assessment Patient needs continued PT services  PT Problem List Decreased strength;Decreased activity tolerance;Decreased balance;Decreased mobility;Decreased range of motion;Decreased knowledge of precautions       PT Treatment Interventions DME instruction;Gait training;Functional mobility training;Therapeutic activities;Therapeutic exercise;Balance training;Stair training;Neuromuscular re-education;Patient/family education    PT Goals (Current goals can be found in the Care Plan section)  Acute Rehab PT Goals Patient Stated Goal: to go home PT Goal Formulation: With patient Time For Goal Achievement: 09/08/23 Potential to Achieve Goals: Good    Frequency BID     Co-evaluation               AM-PAC PT "6 Clicks" Mobility  Outcome Measure Help needed turning from your back to your side while in a flat bed without using bedrails?: None Help needed moving from lying on your back to sitting on the side of a flat bed without using bedrails?: None Help needed moving to and from a bed to a chair (including a wheelchair)?: A Little Help needed standing up from a chair using your arms (e.g., wheelchair or bedside chair)?: A Little Help needed to walk in hospital room?: A Little Help needed climbing 3-5 steps with a railing? : A Little 6 Click Score: 20    End of Session   Activity Tolerance: Patient tolerated treatment well Patient left: in chair;with call bell/phone within reach Nurse Communication: Mobility status PT Visit Diagnosis: Unsteadiness on feet (R26.81);Muscle weakness (generalized) (M62.81);Difficulty in walking, not elsewhere classified (R26.2)    Time: 1610-9604 PT Time Calculation (min) (ACUTE ONLY): 32 min   Charges:   PT Evaluation $PT Eval  Moderate Complexity: 1 Mod PT Treatments $Therapeutic Activity: 8-22 mins PT General Charges $$ ACUTE PT VISIT: 1 Visit         Janine Melbourne, PT, DPT Physical Therapist - [facility]  [facility]   Dreya Buhrman A Macarthur Lorusso 08/25/2023, 10:06 AM

## 2023-08-25 NOTE — Plan of Care (Signed)
  Problem: Activity: Goal: Ability to avoid complications of mobility impairment will improve Outcome: Progressing Goal: Range of joint motion will improve Outcome: Progressing   Problem: Pain Management: Goal: Pain level will decrease with appropriate interventions Outcome: Progressing   

## 2023-08-25 NOTE — Discharge Instructions (Signed)
 Instructions after Total Knee Replacement   Tracey Jensen M.D.     Dept. of Orthopaedics & Sports Medicine  [facility]  [address]  [place], [place]  [address]  Phone: [phone]   Fax: [phone]    DIET: Drink plenty of non-alcoholic fluids. Resume your normal diet. Include foods high in fiber.  ACTIVITY:  You may use crutches or a walker with weight-bearing as tolerated, unless instructed otherwise. You may be weaned off of the walker or crutches by your Physical Therapist.  Do NOT place pillows under the knee. Anything placed under the knee could limit your ability to straighten the knee.   Continue doing gentle exercises. Exercising will reduce the pain and swelling, increase motion, and prevent muscle weakness.   Please continue to use the TED compression stockings for 2 weeks. You may remove the stockings at night, but should reapply them in the morning. Do not drive or operate any equipment until instructed.  WOUND CARE:  Continue to use the PolarCare or ice packs periodically to reduce pain and swelling. You may begin showering 3 days after surgery with honeycomb dressing. Remove honeycomb dressing 7 days after surgery and continue showering. Allow dermabond to fall off on its own.  MEDICATIONS: You may resume your regular medications. Please take the pain medication as prescribed on the medication. Do not take pain medication on an empty stomach. You have been given a prescription for a blood thinner (Lovenox or Coumadin). Please take the medication as instructed. (NOTE: After completing a 2 week course of Lovenox, take one 81 mg Enteric-coated aspirin twice a day for 3 additional weeks. This along with elevation will help reduce the possibility of phlebitis in your operated leg.) Do not drive or drink alcoholic beverages when taking pain medications.  POSTOPERATIVE CONSTIPATION PROTOCOL Constipation - defined medically as fewer than three stools per  week and severe constipation as less than one stool per week.  One of the most common issues patients have following surgery is constipation.  Even if you have a regular bowel pattern at home, your normal regimen is likely to be disrupted due to multiple reasons following surgery.  Combination of anesthesia, postoperative narcotics, change in appetite and fluid intake all can affect your bowels.  In order to avoid complications following surgery, here are some recommendations in order to help you during your recovery period.  Colace (docusate) - Pick up an over-the-counter form of Colace or another stool softener and take twice a day as long as you are requiring postoperative pain medications.  Take with a full glass of water daily.  If you experience loose stools or diarrhea, hold the colace until you stool forms back up.  If your symptoms do not get better within 1 week or if they get worse, check with your doctor.  Dulcolax (bisacodyl) - Pick up over-the-counter and take as directed by the product packaging as needed to assist with the movement of your bowels.  Take with a full glass of water.  Use this product as needed if not relieved by Colace only.   MiraLax (polyethylene glycol) - Pick up over-the-counter to have on hand.  MiraLax is a solution that will increase the amount of water in your bowels to assist with bowel movements.  Take as directed and can mix with a glass of water, juice, soda, coffee, or tea.  Take if you go more than two days without a movement. Do not use MiraLax more than once per day.  Call your doctor if you are still constipated or irregular after using this medication for 7 days in a row.  If you continue to have problems with postoperative constipation, please contact the office for further assistance and recommendations.  If you experience "the worst abdominal pain ever" or develop nausea or vomiting, please contact the office immediatly for further recommendations for  treatment.   CALL THE OFFICE FOR: Temperature above 101 degrees Excessive bleeding or drainage on the dressing. Excessive swelling, coldness, or paleness of the toes. Persistent nausea and vomiting.  FOLLOW-UP:  You should have an appointment to return to the office in 14 days after surgery. Arrangements have been made for continuation of Physical Therapy (either home therapy or outpatient therapy).

## 2024-03-26 ENCOUNTER — Other Ambulatory Visit: Payer: Self-pay | Admitting: Obstetrics and Gynecology

## 2024-03-26 DIAGNOSIS — Z79890 Hormone replacement therapy (postmenopausal): Secondary | ICD-10-CM

## 2024-04-18 ENCOUNTER — Other Ambulatory Visit: Payer: Self-pay | Admitting: Orthopedic Surgery

## 2024-05-02 ENCOUNTER — Encounter
Admission: RE | Admit: 2024-05-02 | Discharge: 2024-05-02 | Disposition: A | Discharge status: Home / Self Care | Source: Ambulatory Visit | Attending: Orthopedic Surgery | Admitting: Orthopedic Surgery

## 2024-05-02 ENCOUNTER — Encounter: Payer: Self-pay | Admitting: Orthopedic Surgery

## 2024-05-02 ENCOUNTER — Other Ambulatory Visit: Payer: Self-pay

## 2024-05-02 VITALS — BP 145/71 | HR 66 | Temp 98.0°F | Resp 18 | Ht 67.0 in | Wt 230.6 lb

## 2024-05-02 DIAGNOSIS — Z01818 Encounter for other preprocedural examination: Secondary | ICD-10-CM | POA: Diagnosis present

## 2024-05-02 DIAGNOSIS — Z01810 Encounter for preprocedural cardiovascular examination: Secondary | ICD-10-CM | POA: Diagnosis not present

## 2024-05-02 DIAGNOSIS — M1711 Unilateral primary osteoarthritis, right knee: Secondary | ICD-10-CM | POA: Diagnosis not present

## 2024-05-02 LAB — COMPREHENSIVE METABOLIC PANEL WITH GFR
ALT: 15 U/L (ref 0–44)
AST: 24 U/L (ref 15–41)
Albumin: 4.2 g/dL (ref 3.5–5.0)
Alkaline Phosphatase: 77 U/L (ref 38–126)
Anion gap: 9 (ref 5–15)
BUN: 19 mg/dL (ref 8–23)
CO2: 26 mmol/L (ref 22–32)
Calcium: 9.1 mg/dL (ref 8.9–10.3)
Chloride: 107 mmol/L (ref 98–111)
Creatinine, Ser: 0.63 mg/dL (ref 0.44–1.00)
GFR, Estimated: >60 mL/min
Glucose, Bld: 94 mg/dL (ref 70–99)
Potassium: 4.5 mmol/L (ref 3.5–5.1)
Sodium: 141 mmol/L (ref 135–145)
Total Bilirubin: 0.8 mg/dL (ref 0.0–1.2)
Total Protein: 6.7 g/dL (ref 6.5–8.1)

## 2024-05-02 LAB — URINALYSIS, ROUTINE W REFLEX MICROSCOPIC
Bilirubin Urine: NEGATIVE
Glucose, UA: NEGATIVE mg/dL
Hgb urine dipstick: NEGATIVE
Ketones, ur: NEGATIVE mg/dL
Leukocytes,Ua: NEGATIVE
Nitrite: NEGATIVE
Protein, ur: NEGATIVE mg/dL
Specific Gravity, Urine: 1.012 (ref 1.005–1.030)
pH: 6 (ref 5.0–8.0)

## 2024-05-02 LAB — CBC WITH DIFFERENTIAL/PLATELET
Abs Immature Granulocytes: 0.01 K/uL (ref 0.00–0.07)
Basophils Absolute: 0 K/uL (ref 0.0–0.1)
Basophils Relative: 1 %
Eosinophils Absolute: 0 K/uL (ref 0.0–0.5)
Eosinophils Relative: 1 %
HCT: 35.8 % — ABNORMAL LOW (ref 36.0–46.0)
Hemoglobin: 11.8 g/dL — ABNORMAL LOW (ref 12.0–15.0)
Immature Granulocytes: 0 %
Lymphocytes Relative: 21 %
Lymphs Abs: 1 K/uL (ref 0.7–4.0)
MCH: 30.6 pg (ref 26.0–34.0)
MCHC: 33 g/dL (ref 30.0–36.0)
MCV: 92.7 fL (ref 80.0–100.0)
Monocytes Absolute: 0.3 K/uL (ref 0.1–1.0)
Monocytes Relative: 7 %
Neutro Abs: 3.2 K/uL (ref 1.7–7.7)
Neutrophils Relative %: 70 %
Platelets: 222 K/uL (ref 150–400)
RBC: 3.86 MIL/uL — ABNORMAL LOW (ref 3.87–5.11)
RDW: 13.2 % (ref 11.5–15.5)
WBC: 4.5 K/uL (ref 4.0–10.5)
nRBC: 0 % (ref 0.0–0.2)

## 2024-05-02 LAB — SURGICAL PCR SCREEN
MRSA, PCR: NEGATIVE
Staphylococcus aureus: NEGATIVE

## 2024-05-02 NOTE — Patient Instructions (Addendum)
 Your procedure is scheduled on: Jan 03/2025  Report to the Registration Desk on the 1st floor of the [facility] To find out your arrival time, please call [phone] between 1PM - 3PM on: Jan 12/2024 ,Friday If your arrival time is 6:00 am, do not arrive before that time as the [facility] entrance doors do not open until 6:00 am.  REMEMBER: Instructions that are not followed completely may result in serious medical risk, up to and including death; or upon the discretion of your surgeon and anesthesiologist your surgery may need to be rescheduled.  Do not eat food after midnight the night before surgery.  No gum chewing or hard candies.  You may however, drink CLEAR liquids up to 2 hours before you are scheduled to arrive for your surgery. Do not drink anything within 2 hours of your scheduled arrival time.  Clear liquids include: - water   - apple juice without pulp - gatorade (not RED colors) - black coffee or tea (Do NOT add milk or creamers to the coffee or tea) Do NOT drink anything that is not on this list.   In addition, your doctor has ordered for you to drink the provided:  Ensure Pre-Surgery Clear Carbohydrate Drink   Drinking this carbohydrate drink up to two hours before surgery helps to reduce insulin resistance and improve patient outcomes. Please complete drinking 2 hours before scheduled arrival time.  One week prior to surgery: Stop Anti-inflammatories (NSAIDS) such as Advil, Aleve, Ibuprofen, Motrin, Naproxen, Naprosyn and Aspirin based products such as Excedrin, Goody's Powder, BC Powder. Stop ANY OVER THE COUNTER supplements until after surgery.  You may however, continue to take Tylenol  if needed for pain up until the day of surgery.   Continue taking all of your other prescription medications up until the day of surgery.  ON THE DAY OF SURGERY ONLY TAKE THESE MEDICATIONS WITH SIPS OF WATER :  levothyroxine  (SYNTHROID )     No Alcohol for 24 hours  before or after surgery.  No Smoking including e-cigarettes for 24 hours before surgery.  No chewable tobacco products for at least 6 hours before surgery.  No nicotine patches on the day of surgery.  Do not use any recreational drugs for at least a week (preferably 2 weeks) before your surgery.  Please be advised that the combination of cocaine and anesthesia may have negative outcomes, up to and including death. If you test positive for cocaine, your surgery will be cancelled.  On the morning of surgery brush your teeth with toothpaste and water , you may rinse your mouth with mouthwash if you wish. Do not swallow any toothpaste or mouthwash.  Use CHG Soap or wipes as directed on instruction sheet.  Do not wear jewelry, make-up, hairpins, clips or nail polish.   Do not wear lotions, powders, or perfumes.   Do not shave body hair from the neck down 48 hours before surgery.  Contact lenses, hearing aids and dentures may not be worn into surgery.  Do not bring valuables to the hospital. [facility] is not responsible for any missing/lost belongings or valuables.    Notify your doctor if there is any change in your medical condition (cold, fever, infection).  Wear comfortable clothing (specific to your surgery type) to the hospital.  After surgery, you can help prevent lung complications by doing breathing exercises.  Take deep breaths and cough every 1-2 hours. Your doctor may order a device called an Incentive Spirometer to help you take deep breaths.  If you are being admitted to the hospital overnight, leave your suitcase in the car. After surgery it may be brought to your room.  In case of increased patient census, it may be necessary for you, the patient, to continue your postoperative care in the Same Day Surgery department.  If you are being discharged the day of surgery, you will not be allowed to drive home. You will need a responsible individual to drive you home and  stay with you for 24 hours after surgery.    Please call the Pre-admissions Testing Dept. at [phone] if you have any questions about these instructions.  Surgery Visitation Policy:  Patients having surgery or a procedure may have two visitors.  Children under the age of 13 must have an adult with them who is not the patient.  Inpatient Visitation:    Visiting hours are 7 a.m. to 8 p.m. Up to four visitors are allowed at one time in a patient room. The visitors may rotate out with other people during the day.  One visitor age 79 or older may stay with the patient overnight and must be in the room by 8 p.m.   Merchandiser, Retail to address health-related social needs:  [email or web address]  Pre-operative 4 CHG Bath Instructions   You can play a key role in reducing the risk of infection after surgery. Your skin needs to be as free of germs as possible. You can reduce the number of germs on your skin by washing with CHG (chlorhexidine  gluconate) soap before surgery. CHG is an antiseptic soap that kills germs and continues to kill germs even after washing.   DO NOT use if you have an allergy to chlorhexidine /CHG or antibacterial soaps. If your skin becomes reddened or irritated, stop using the CHG and notify one of our RNs at [phone].   Please shower with the CHG soap starting 4 days before surgery using the following schedule:        Please keep in mind the following:  DO NOT shave, including legs and underarms, starting the day of your first shower.   You may shave your face at any point before/day of surgery.  Place clean sheets on your bed the day you start using CHG soap. Use a clean washcloth (not used since being washed) for each shower. DO NOT sleep with pets once you start using the CHG.   CHG Shower Instructions:  If you choose to wash your hair and private area, wash first with your normal shampoo/soap.  After you use shampoo/soap,  rinse your hair and body thoroughly to remove shampoo/soap residue.  Turn the water  OFF and apply about 3 tablespoons (45 ml) of CHG soap to a CLEAN washcloth.  Apply CHG soap ONLY FROM YOUR NECK DOWN TO YOUR TOES (washing for 3-5 minutes)  DO NOT use CHG soap on face, private areas, open wounds, or sores.  Pay special attention to the area where your surgery is being performed.  If you are having back surgery, having someone wash your back for you may be helpful. Wait 2 minutes after CHG soap is applied, then you may rinse off the CHG soap.  Pat dry with a clean towel  Put on clean clothes/pajamas   If you choose to wear lotion, please use ONLY the CHG-compatible lotions on the back of this paper.     Additional instructions for the day of surgery: DO NOT APPLY any lotions, deodorants, cologne, or perfumes.   Put on clean/comfortable  clothes.  Brush your teeth.  Ask your nurse before applying any prescription medications to the skin.      CHG Compatible Lotions   Aveeno Moisturizing lotion  Cetaphil Moisturizing Cream  Cetaphil Moisturizing Lotion  Clairol Herbal Essence Moisturizing Lotion, Dry Skin  Clairol Herbal Essence Moisturizing Lotion, Extra Dry Skin  Clairol Herbal Essence Moisturizing Lotion, Normal Skin  Curel Age Defying Therapeutic Moisturizing Lotion with Alpha Hydroxy  Curel Extreme Care Body Lotion  Curel Soothing Hands Moisturizing Hand Lotion  Curel Therapeutic Moisturizing Cream, Fragrance-Free  Curel Therapeutic Moisturizing Lotion, Fragrance-Free  Curel Therapeutic Moisturizing Lotion, Original Formula  Eucerin Daily Replenishing Lotion  Eucerin Dry Skin Therapy Plus Alpha Hydroxy Crme  Eucerin Dry Skin Therapy Plus Alpha Hydroxy Lotion  Eucerin Original Crme  Eucerin Original Lotion  Eucerin Plus Crme Eucerin Plus Lotion  Eucerin TriLipid Replenishing Lotion  Keri Anti-Bacterial Hand Lotion  Keri Deep Conditioning Original Lotion Dry Skin Formula  Softly Scented  Keri Deep Conditioning Original Lotion, Fragrance Free Sensitive Skin Formula  Keri Lotion Fast Absorbing Fragrance Free Sensitive Skin Formula  Keri Lotion Fast Absorbing Softly Scented Dry Skin Formula  Keri Original Lotion  Keri Skin Renewal Lotion Keri Silky Smooth Lotion  Keri Silky Smooth Sensitive Skin Lotion  Nivea Body Creamy Conditioning Oil  Nivea Body Extra Enriched Lotion  Nivea Body Original Lotion  Nivea Body Sheer Moisturizing Lotion Nivea Crme  Nivea Skin Firming Lotion  NutraDerm 30 Skin Lotion  NutraDerm Skin Lotion  NutraDerm Therapeutic Skin Cream  NutraDerm Therapeutic Skin Lotion  ProShield Protective Hand Cream  Provon moisturizing lotion   How to Use an Incentive Spirometer  An incentive spirometer is a tool that measures how well you are filling your lungs with each breath. Learning to take long, deep breaths using this tool can help you keep your lungs clear and active. This may help to reverse or lessen your chance of developing breathing (pulmonary) problems, especially infection. You may be asked to use a spirometer: After a surgery. If you have a lung problem or a history of smoking. After a long period of time when you have been unable to move or be active. If the spirometer includes an indicator to show the highest number that you have reached, your health care provider or respiratory therapist will help you set a goal. Keep a log of your progress as told by your health care provider. What are the risks? Breathing too quickly may cause dizziness or cause you to pass out. Take your time so you do not get dizzy or light-headed. If you are in pain, you may need to take pain medicine before doing incentive spirometry. It is harder to take a deep breath if you are having pain. How to use your incentive spirometer  Sit up on the edge of your bed or on a chair. Hold the incentive spirometer so that it is in an upright position. Before you  use the spirometer, breathe out normally. Place the mouthpiece in your mouth. Make sure your lips are closed tightly around it. Breathe in slowly and as deeply as you can through your mouth, causing the piston or the Torain to rise toward the top of the chamber. Hold your breath for 3-5 seconds, or for as long as possible. If the spirometer includes a coach indicator, use this to guide you in breathing. Slow down your breathing if the indicator goes above the marked areas. Remove the mouthpiece from your mouth and breathe out normally. The  piston or Lagares will return to the bottom of the chamber. Rest for a few seconds, then repeat the steps 10 or more times. Take your time and take a few normal breaths between deep breaths so that you do not get dizzy or light-headed. Do this every 1-2 hours when you are awake. If the spirometer includes a goal marker to show the highest number you have reached (best effort), use this as a goal to work toward during each repetition. After each set of 10 deep breaths, cough a few times. This will help to make sure that your lungs are clear. If you have an incision on your chest or abdomen from surgery, place a pillow or a rolled-up towel firmly against the incision when you cough. This can help to reduce pain while taking deep breaths and coughing. General tips When you are able to get out of bed: Walk around often. Continue to take deep breaths and cough in order to clear your lungs. Keep using the incentive spirometer until your health care provider says it is okay to stop using it. If you have been in the hospital, you may be told to keep using the spirometer at home. Contact a health care provider if: You are having difficulty using the spirometer. You have trouble using the spirometer as often as instructed. Your pain medicine is not giving enough relief for you to use the spirometer as told. You have a fever. Get help right away if: You develop shortness of  breath. You develop a cough with bloody mucus from the lungs. You have fluid or blood coming from an incision site after you cough. Summary An incentive spirometer is a tool that can help you learn to take long, deep breaths to keep your lungs clear and active. You may be asked to use a spirometer after a surgery, if you have a lung problem or a history of smoking, or if you have been inactive for a long period of time. Use your incentive spirometer as instructed every 1-2 hours while you are awake. If you have an incision on your chest or abdomen, place a pillow or a rolled-up towel firmly against your incision when you cough. This will help to reduce pain. Get help right away if you have shortness of breath, you cough up bloody mucus, or blood comes from your incision when you cough. This information is not intended to replace advice given to you by your health care provider. Make sure you discuss any questions you have with your health care provider. Document Revised: 07/04/2019 Document Reviewed: 07/04/2019 Elsevier Patient Education  2023 Elsevier Inc.     Preoperative Educational Videos for Total Hip, Knee and Shoulder Replacements  To better prepare for surgery, please view our videos that explain the physical activity and discharge planning required to have the best surgical recovery at [facility].  [email or web address]  Questions? Call [phone] or email [email or web address]

## 2024-05-09 ENCOUNTER — Other Ambulatory Visit: Payer: Self-pay

## 2024-05-09 ENCOUNTER — Ambulatory Visit

## 2024-05-09 ENCOUNTER — Encounter: Payer: Self-pay | Admitting: Orthopedic Surgery

## 2024-05-09 ENCOUNTER — Ambulatory Visit: Admitting: Certified Registered"

## 2024-05-09 ENCOUNTER — Ambulatory Visit
Admission: RE | Admit: 2024-05-09 | Discharge: 2024-05-10 | Disposition: A | Attending: Orthopedic Surgery | Admitting: Orthopedic Surgery

## 2024-05-09 ENCOUNTER — Encounter: Admission: RE | Disposition: A | Payer: Self-pay | Source: Home / Self Care | Attending: Orthopedic Surgery

## 2024-05-09 DIAGNOSIS — Z87891 Personal history of nicotine dependence: Secondary | ICD-10-CM | POA: Insufficient documentation

## 2024-05-09 DIAGNOSIS — E039 Hypothyroidism, unspecified: Secondary | ICD-10-CM | POA: Diagnosis not present

## 2024-05-09 DIAGNOSIS — Z96651 Presence of right artificial knee joint: Secondary | ICD-10-CM

## 2024-05-09 DIAGNOSIS — M1711 Unilateral primary osteoarthritis, right knee: Secondary | ICD-10-CM | POA: Diagnosis present

## 2024-05-09 DIAGNOSIS — Z79890 Hormone replacement therapy (postmenopausal): Secondary | ICD-10-CM | POA: Insufficient documentation

## 2024-05-09 HISTORY — PX: TOTAL KNEE ARTHROPLASTY: SHX125

## 2024-05-09 HISTORY — DX: Unilateral primary osteoarthritis, right knee: M17.11

## 2024-05-09 SURGERY — ARTHROPLASTY, KNEE, TOTAL
Anesthesia: Spinal | Site: Knee | Laterality: Right

## 2024-05-09 MED ORDER — TRAMADOL HCL 50 MG PO TABS
50.0000 mg | ORAL_TABLET | Freq: Four times a day (QID) | ORAL | 0 refills | Status: AC | PRN
  Filled 2024-05-09: qty 30, 8d supply, fill #0

## 2024-05-09 MED ORDER — ACETAMINOPHEN 10 MG/ML IV SOLN
INTRAVENOUS | Status: AC
  Filled 2024-05-09: qty 100

## 2024-05-09 MED ORDER — PHENYLEPHRINE HCL-NACL 20-0.9 MG/250ML-% IV SOLN
INTRAVENOUS | Status: AC
  Filled 2024-05-09: qty 250

## 2024-05-09 MED ORDER — TRAMADOL HCL 50 MG PO TABS
50.0000 mg | ORAL_TABLET | Freq: Four times a day (QID) | ORAL | Status: DC | PRN
  Administered 2024-05-09 – 2024-05-10 (×2): 50 mg via ORAL
  Filled 2024-05-09 (×2): qty 1

## 2024-05-09 MED ORDER — BUPIVACAINE-EPINEPHRINE (PF) 0.25% -1:200000 IJ SOLN
INTRAMUSCULAR | Status: AC
  Filled 2024-05-09: qty 30

## 2024-05-09 MED ORDER — SURGIPHOR WOUND IRRIGATION SYSTEM - OPTIME: TOPICAL | Status: DC | PRN

## 2024-05-09 MED ORDER — ONDANSETRON HCL 4 MG PO TABS
4.0000 mg | ORAL_TABLET | Freq: Four times a day (QID) | ORAL | 0 refills | Status: AC | PRN
  Filled 2024-05-09: qty 20, 5d supply, fill #0

## 2024-05-09 MED ORDER — DOCUSATE SODIUM 100 MG PO CAPS
100.0000 mg | ORAL_CAPSULE | Freq: Two times a day (BID) | ORAL | Status: DC
  Administered 2024-05-09 – 2024-05-10 (×2): 100 mg via ORAL
  Filled 2024-05-09 (×2): qty 1

## 2024-05-09 MED ORDER — CEFAZOLIN SODIUM-DEXTROSE 2-4 GM/100ML-% IV SOLN
2.0000 g | INTRAVENOUS | Status: AC
  Administered 2024-05-09: 2 g via INTRAVENOUS

## 2024-05-09 MED ORDER — PANTOPRAZOLE SODIUM 40 MG PO TBEC
40.0000 mg | DELAYED_RELEASE_TABLET | Freq: Every day | ORAL | Status: DC
  Administered 2024-05-09 – 2024-05-10 (×2): 40 mg via ORAL
  Filled 2024-05-09 (×2): qty 1

## 2024-05-09 MED ORDER — ACETAMINOPHEN 500 MG PO TABS
1000.0000 mg | ORAL_TABLET | Freq: Three times a day (TID) | ORAL | Status: DC
  Administered 2024-05-09 – 2024-05-10 (×3): 1000 mg via ORAL
  Filled 2024-05-09 (×3): qty 2

## 2024-05-09 MED ORDER — ENOXAPARIN SODIUM 40 MG/0.4ML IJ SOSY
40.0000 mg | PREFILLED_SYRINGE | INTRAMUSCULAR | 0 refills | Status: AC
  Filled 2024-05-09: qty 5.6, 14d supply, fill #0

## 2024-05-09 MED ORDER — PROPOFOL 1000 MG/100ML IV EMUL
INTRAVENOUS | Status: AC
  Filled 2024-05-09: qty 100

## 2024-05-09 MED ORDER — MENTHOL 3 MG MT LOZG: 1.0000 | LOZENGE | OROMUCOSAL | Status: DC | PRN

## 2024-05-09 MED ORDER — SODIUM CHLORIDE 0.9 % IR SOLN
Status: DC | PRN
  Administered 2024-05-09: 3000 mL

## 2024-05-09 MED ORDER — DOCUSATE SODIUM 100 MG PO CAPS
100.0000 mg | ORAL_CAPSULE | Freq: Two times a day (BID) | ORAL | 0 refills | Status: AC
  Filled 2024-05-09: qty 10, 5d supply, fill #0

## 2024-05-09 MED ORDER — ACETAMINOPHEN 500 MG PO TABS
1000.0000 mg | ORAL_TABLET | Freq: Three times a day (TID) | ORAL | 0 refills | Status: AC
  Filled 2024-05-09: qty 30, 5d supply, fill #0

## 2024-05-09 MED ORDER — ACETAMINOPHEN 325 MG PO TABS: 325.0000 mg | ORAL_TABLET | Freq: Four times a day (QID) | ORAL | Status: DC | PRN

## 2024-05-09 MED ORDER — HYDROCODONE-ACETAMINOPHEN 5-325 MG PO TABS
1.0000 | ORAL_TABLET | ORAL | Status: DC | PRN
  Administered 2024-05-09: 2 via ORAL
  Filled 2024-05-09: qty 2

## 2024-05-09 MED ORDER — SODIUM CHLORIDE 0.9 % IV SOLN: INTRAVENOUS | Status: DC

## 2024-05-09 MED ORDER — ONDANSETRON HCL 4 MG PO TABS: 4.0000 mg | ORAL_TABLET | Freq: Four times a day (QID) | ORAL | Status: DC | PRN

## 2024-05-09 MED ORDER — MIDAZOLAM HCL 2 MG/2ML IJ SOLN
INTRAMUSCULAR | Status: AC
  Filled 2024-05-09: qty 2

## 2024-05-09 MED ORDER — ORAL CARE MOUTH RINSE: 15.0000 mL | Freq: Once | OROMUCOSAL | Status: AC

## 2024-05-09 MED ORDER — SODIUM CHLORIDE (PF) 0.9 % IJ SOLN
INTRAMUSCULAR | Status: AC
  Filled 2024-05-09: qty 20

## 2024-05-09 MED ORDER — OXYCODONE HCL 5 MG PO TABS: 5.0000 mg | ORAL_TABLET | Freq: Once | ORAL | Status: DC | PRN

## 2024-05-09 MED ORDER — FENTANYL CITRATE (PF) 100 MCG/2ML IJ SOLN: 25.0000 ug | INTRAMUSCULAR | Status: DC | PRN

## 2024-05-09 MED ORDER — PHENYLEPHRINE HCL-NACL 20-0.9 MG/250ML-% IV SOLN
INTRAVENOUS | Status: DC | PRN
  Administered 2024-05-09: 25 ug/min via INTRAVENOUS

## 2024-05-09 MED ORDER — CEFAZOLIN SODIUM-DEXTROSE 2-4 GM/100ML-% IV SOLN
INTRAVENOUS | Status: AC
  Filled 2024-05-09: qty 100

## 2024-05-09 MED ORDER — METOCLOPRAMIDE HCL 5 MG/ML IJ SOLN: 5.0000 mg | Freq: Three times a day (TID) | INTRAMUSCULAR | Status: DC | PRN

## 2024-05-09 MED ORDER — BUPIVACAINE HCL (PF) 0.5 % IJ SOLN
INTRAMUSCULAR | Status: DC | PRN
  Administered 2024-05-09: 2.8 mL

## 2024-05-09 MED ORDER — SODIUM CHLORIDE (PF) 0.9 % IJ SOLN
INTRAMUSCULAR | Status: DC | PRN
  Administered 2024-05-09: 71 mL

## 2024-05-09 MED ORDER — DEXMEDETOMIDINE HCL IN NACL 80 MCG/20ML IV SOLN
INTRAVENOUS | Status: DC | PRN
  Administered 2024-05-09: 4 ug via INTRAVENOUS
  Administered 2024-05-09: 8 ug via INTRAVENOUS

## 2024-05-09 MED ORDER — ONDANSETRON HCL 4 MG/2ML IJ SOLN: 4.0000 mg | Freq: Four times a day (QID) | INTRAMUSCULAR | Status: DC | PRN

## 2024-05-09 MED ORDER — ACETAMINOPHEN 10 MG/ML IV SOLN
INTRAVENOUS | Status: DC | PRN
  Administered 2024-05-09: 1000 mg via INTRAVENOUS

## 2024-05-09 MED ORDER — TRANEXAMIC ACID-NACL 1000-0.7 MG/100ML-% IV SOLN
INTRAVENOUS | Status: AC
  Filled 2024-05-09: qty 100

## 2024-05-09 MED ORDER — ENOXAPARIN SODIUM 30 MG/0.3ML IJ SOSY
30.0000 mg | PREFILLED_SYRINGE | Freq: Two times a day (BID) | INTRAMUSCULAR | Status: DC
  Administered 2024-05-10: 30 mg via SUBCUTANEOUS
  Filled 2024-05-09: qty 0.3

## 2024-05-09 MED ORDER — ESTRADIOL 0.5 MG PO TABS
1.0000 mg | ORAL_TABLET | Freq: Every day | ORAL | Status: DC
  Filled 2024-05-09: qty 2

## 2024-05-09 MED ORDER — PROPOFOL 500 MG/50ML IV EMUL
INTRAVENOUS | Status: DC | PRN
  Administered 2024-05-09: 100 ug/kg/min via INTRAVENOUS
  Administered 2024-05-09: 115 ug/kg/min via INTRAVENOUS

## 2024-05-09 MED ORDER — ACETAMINOPHEN 10 MG/ML IV SOLN: 1000.0000 mg | Freq: Once | INTRAVENOUS | Status: DC | PRN

## 2024-05-09 MED ORDER — PHENOL 1.4 % MT LIQD: 1.0000 | OROMUCOSAL | Status: DC | PRN

## 2024-05-09 MED ORDER — MORPHINE SULFATE (PF) 2 MG/ML IV SOLN: 0.5000 mg | INTRAVENOUS | Status: DC | PRN

## 2024-05-09 MED ORDER — METOCLOPRAMIDE HCL 10 MG PO TABS: 5.0000 mg | ORAL_TABLET | Freq: Three times a day (TID) | ORAL | Status: DC | PRN

## 2024-05-09 MED ORDER — CEFAZOLIN SODIUM-DEXTROSE 2-4 GM/100ML-% IV SOLN
2.0000 g | Freq: Four times a day (QID) | INTRAVENOUS | Status: AC
  Administered 2024-05-09 (×2): 2 g via INTRAVENOUS
  Filled 2024-05-09 (×2): qty 100

## 2024-05-09 MED ORDER — ATORVASTATIN CALCIUM 10 MG PO TABS
10.0000 mg | ORAL_TABLET | Freq: Every evening | ORAL | Status: DC
  Administered 2024-05-09: 10 mg via ORAL
  Filled 2024-05-09: qty 1

## 2024-05-09 MED ORDER — ONDANSETRON HCL 4 MG/2ML IJ SOLN
INTRAMUSCULAR | Status: DC | PRN
  Administered 2024-05-09: 4 mg via INTRAVENOUS

## 2024-05-09 MED ORDER — LEVOTHYROXINE SODIUM 25 MCG PO TABS
100.0000 ug | ORAL_TABLET | Freq: Every day | ORAL | Status: DC
  Administered 2024-05-10: 100 ug via ORAL
  Filled 2024-05-09: qty 4

## 2024-05-09 MED ORDER — OXYCODONE HCL 5 MG/5ML PO SOLN: 5.0000 mg | Freq: Once | ORAL | Status: DC | PRN

## 2024-05-09 MED ORDER — KETOROLAC TROMETHAMINE 15 MG/ML IJ SOLN
7.5000 mg | Freq: Four times a day (QID) | INTRAMUSCULAR | Status: AC
  Administered 2024-05-09 – 2024-05-10 (×3): 7.5 mg via INTRAVENOUS
  Filled 2024-05-09 (×4): qty 1

## 2024-05-09 MED ORDER — DROPERIDOL 2.5 MG/ML IJ SOLN: 0.6250 mg | Freq: Once | INTRAMUSCULAR | Status: DC | PRN

## 2024-05-09 MED ORDER — TRANEXAMIC ACID-NACL 1000-0.7 MG/100ML-% IV SOLN
1000.0000 mg | INTRAVENOUS | Status: AC
  Administered 2024-05-09 (×2): 1000 mg via INTRAVENOUS

## 2024-05-09 MED ORDER — CHLORHEXIDINE GLUCONATE 0.12 % MT SOLN
OROMUCOSAL | Status: AC
  Filled 2024-05-09: qty 15

## 2024-05-09 MED ORDER — DEXAMETHASONE SOD PHOSPHATE PF 10 MG/ML IJ SOLN
8.0000 mg | Freq: Once | INTRAMUSCULAR | Status: AC
  Administered 2024-05-09: 10 mg via INTRAVENOUS

## 2024-05-09 MED ORDER — BUPIVACAINE LIPOSOME 1.3 % IJ SUSP
INTRAMUSCULAR | Status: AC
  Filled 2024-05-09: qty 20

## 2024-05-09 MED ORDER — MIDAZOLAM HCL 5 MG/5ML IJ SOLN
INTRAMUSCULAR | Status: DC | PRN
  Administered 2024-05-09 (×2): 1 mg via INTRAVENOUS

## 2024-05-09 MED ORDER — CHLORHEXIDINE GLUCONATE 0.12 % MT SOLN
15.0000 mL | Freq: Once | OROMUCOSAL | Status: AC
  Administered 2024-05-09: 15 mL via OROMUCOSAL

## 2024-05-09 MED ORDER — LACTATED RINGERS IV SOLN: INTRAVENOUS | Status: DC

## 2024-05-09 MED ORDER — OXYCODONE HCL 5 MG PO TABS
5.0000 mg | ORAL_TABLET | Freq: Four times a day (QID) | ORAL | 0 refills | Status: AC | PRN
  Filled 2024-05-09: qty 20, 5d supply, fill #0

## 2024-05-09 SURGICAL SUPPLY — 55 items
BLADE PATELLA REAM PILOT HOLE (MISCELLANEOUS) IMPLANT
BLADE SAW 90X13X1.19 OSCILLAT (BLADE) IMPLANT
BLADE SAW SAG 25X90X1.19 (BLADE) ×1 IMPLANT
BLADE SAW SAG 29X58X.64 (BLADE) ×1 IMPLANT
BNDG ELASTIC 6INX 5YD STR LF (GAUZE/BANDAGES/DRESSINGS) ×1 IMPLANT
BOWL CEMENT MIX W/ADAPTER (MISCELLANEOUS) IMPLANT
BRUSH SCRUB EZ PLAIN DRY (MISCELLANEOUS) IMPLANT
CHLORAPREP W/TINT 26 (MISCELLANEOUS) ×2 IMPLANT
COMPONENT FEM STD PS 8 RT (Joint) IMPLANT
COMPONENT PATELLA PEG 3 32 (Joint) IMPLANT
COMPONET TIB PS KNEE 0D E RT (Joint) IMPLANT
COOLER ICEMAN CLASSIC (MISCELLANEOUS) ×1 IMPLANT
CUFF TRNQT CYL 30X4X21-28X (TOURNIQUET CUFF) IMPLANT
DERMABOND ADVANCED .7 DNX12 (GAUZE/BANDAGES/DRESSINGS) ×1 IMPLANT
DRAPE SHEET LG 3/4 BI-LAMINATE (DRAPES) ×2 IMPLANT
DRSG MEPILEX SACRM 8.7X9.8 (GAUZE/BANDAGES/DRESSINGS) ×1 IMPLANT
DRSG OPSITE POSTOP 4X10 (GAUZE/BANDAGES/DRESSINGS) IMPLANT
DRSG OPSITE POSTOP 4X8 (GAUZE/BANDAGES/DRESSINGS) IMPLANT
ELECTRODE REM PT RTRN 9FT ADLT (ELECTROSURGICAL) ×1 IMPLANT
GLOVE BIO SURGEON STRL SZ8 (GLOVE) ×1 IMPLANT
GLOVE BIOGEL PI IND STRL 8 (GLOVE) ×1 IMPLANT
GLOVE PI ORTHO PRO STRL 7.5 (GLOVE) ×2 IMPLANT
GLOVE PI ORTHO PRO STRL SZ8 (GLOVE) ×2 IMPLANT
GLOVE SURG SYN 7.5 PF PI (GLOVE) ×1 IMPLANT
GOWN SRG XL LONG LVL 3 NONREIN (GOWNS) ×1 IMPLANT
GOWN SRG XL LVL 3 NONREINFORCE (GOWNS) ×1 IMPLANT
GOWN STRL REUS W/ TWL LRG LVL3 (GOWN DISPOSABLE) ×1 IMPLANT
HOOD PEEL AWAY T7 (MISCELLANEOUS) ×2 IMPLANT
INSERT TIB ARTI SZ8-11 RT 11 (Joint) IMPLANT
KIT TURNOVER KIT A (KITS) ×1 IMPLANT
MANIFOLD NEPTUNE II (INSTRUMENTS) ×1 IMPLANT
MARKER SKIN DUAL TIP RULER LAB (MISCELLANEOUS) ×1 IMPLANT
MAT ABSORB FLUID 56X50 GRAY (MISCELLANEOUS) ×1 IMPLANT
NEEDLE HYPO 21X1.5 SAFETY (NEEDLE) ×1 IMPLANT
PACK TOTAL KNEE (MISCELLANEOUS) ×1 IMPLANT
PAD ARMBOARD POSITIONER FOAM (MISCELLANEOUS) ×3 IMPLANT
PAD COLD UNI WRAP-ON (PAD) ×1 IMPLANT
PENCIL SMOKE EVACUATOR (MISCELLANEOUS) ×1 IMPLANT
PIN DRILL HDLS TROCAR 75 4PK (PIN) IMPLANT
SCREW FEMALE HEX FIX 25X2.5 (ORTHOPEDIC DISPOSABLE SUPPLIES) IMPLANT
SCREW HEX HEADED 3.5X27 DISP (ORTHOPEDIC DISPOSABLE SUPPLIES) IMPLANT
SLEEVE SCD COMPRESS KNEE MED (STOCKING) ×1 IMPLANT
SOL .9 NS 3000ML IRR UROMATIC (IV SOLUTION) ×1 IMPLANT
SOLN STERILE WATER BTL 1000 ML (IV SOLUTION) ×1 IMPLANT
SOLUTION IRRIG SURGIPHOR (IV SOLUTION) ×1 IMPLANT
STOCKINETTE IMPERV 14X48 (MISCELLANEOUS) ×1 IMPLANT
SUT STRATAFIX 14 PDO 36 VLT (SUTURE) ×1 IMPLANT
SUT VIC AB 0 CT1 36 (SUTURE) ×1 IMPLANT
SUT VIC AB 2-0 CT2 27 (SUTURE) ×2 IMPLANT
SUTURE STRATA SPIR 4-0 18 (SUTURE) ×1 IMPLANT
SUTURE VICRYL 1-0 27IN ABS (SUTURE) ×1 IMPLANT
SYR 20ML LL LF (SYRINGE) ×2 IMPLANT
TAPE CLOTH 3X10 WHT NS LF (GAUZE/BANDAGES/DRESSINGS) ×1 IMPLANT
TIP FAN IRRIG PULSAVAC PLUS (DISPOSABLE) ×1 IMPLANT
TRAP FLUID SMOKE EVACUATOR (MISCELLANEOUS) ×1 IMPLANT

## 2024-05-09 NOTE — Interval H&P Note (Signed)
Patient history and physical updated. Consent reviewed including risks, benefits, and alternatives to surgery. Patient agrees with above plan to proceed with right total knee arthroplasty  

## 2024-05-09 NOTE — Discharge Instructions (Signed)
 Instructions after Total Knee Replacement   Tracey Jensen M.D.     Dept. of Orthopaedics & Sports Medicine  [facility]  [address]  [place], [place]  [address]  Phone: [phone]   Fax: [phone]    DIET: Drink plenty of non-alcoholic fluids. Resume your normal diet. Include foods high in fiber.  ACTIVITY:  You may use crutches or a walker with weight-bearing as tolerated, unless instructed otherwise. You may be weaned off of the walker or crutches by your Physical Therapist.  Do NOT place pillows under the knee. Anything placed under the knee could limit your ability to straighten the knee.   Continue doing gentle exercises. Exercising will reduce the pain and swelling, increase motion, and prevent muscle weakness.   Please continue to use the TED compression stockings for 2 weeks. You may remove the stockings at night, but should reapply them in the morning. Do not drive or operate any equipment until instructed.  WOUND CARE:  Continue to use the PolarCare or ice packs periodically to reduce pain and swelling. You may begin showering 3 days after surgery with honeycomb dressing. Remove honeycomb dressing 7 days after surgery and continue showering. Allow dermabond to fall off on its own.  MEDICATIONS: You may resume your regular medications. Please take the pain medication as prescribed on the medication. Do not take pain medication on an empty stomach. You have been given a prescription for a blood thinner (Lovenox or Coumadin). Please take the medication as instructed. (NOTE: After completing a 2 week course of Lovenox, take one 81 mg Enteric-coated aspirin twice a day for 3 additional weeks. This along with elevation will help reduce the possibility of phlebitis in your operated leg.) Do not drive or drink alcoholic beverages when taking pain medications.  POSTOPERATIVE CONSTIPATION PROTOCOL Constipation - defined medically as fewer than three stools per  week and severe constipation as less than one stool per week.  One of the most common issues patients have following surgery is constipation.  Even if you have a regular bowel pattern at home, your normal regimen is likely to be disrupted due to multiple reasons following surgery.  Combination of anesthesia, postoperative narcotics, change in appetite and fluid intake all can affect your bowels.  In order to avoid complications following surgery, here are some recommendations in order to help you during your recovery period.  Colace (docusate) - Pick up an over-the-counter form of Colace or another stool softener and take twice a day as long as you are requiring postoperative pain medications.  Take with a full glass of water daily.  If you experience loose stools or diarrhea, hold the colace until you stool forms back up.  If your symptoms do not get better within 1 week or if they get worse, check with your doctor.  Dulcolax (bisacodyl) - Pick up over-the-counter and take as directed by the product packaging as needed to assist with the movement of your bowels.  Take with a full glass of water.  Use this product as needed if not relieved by Colace only.   MiraLax (polyethylene glycol) - Pick up over-the-counter to have on hand.  MiraLax is a solution that will increase the amount of water in your bowels to assist with bowel movements.  Take as directed and can mix with a glass of water, juice, soda, coffee, or tea.  Take if you go more than two days without a movement. Do not use MiraLax more than once per day.  Call your doctor if you are still constipated or irregular after using this medication for 7 days in a row.  If you continue to have problems with postoperative constipation, please contact the office for further assistance and recommendations.  If you experience "the worst abdominal pain ever" or develop nausea or vomiting, please contact the office immediatly for further recommendations for  treatment.   CALL THE OFFICE FOR: Temperature above 101 degrees Excessive bleeding or drainage on the dressing. Excessive swelling, coldness, or paleness of the toes. Persistent nausea and vomiting.  FOLLOW-UP:  You should have an appointment to return to the office in 14 days after surgery. Arrangements have been made for continuation of Physical Therapy (either home therapy or outpatient therapy).

## 2024-05-09 NOTE — Anesthesia Preprocedure Evaluation (Addendum)
 "                                  Anesthesia Evaluation  Patient identified by MRN, date of birth, ID band Patient awake    Reviewed: Allergy & Precautions, NPO status , Patient's Chart, lab work & pertinent test results  History of Anesthesia Complications (+) PONV and history of anesthetic complications  Airway Mallampati: III  TM Distance: >3 FB Neck ROM: full    Dental  (+) Chipped, Dental Advidsory Given   Pulmonary former smoker   Pulmonary exam normal        Cardiovascular negative cardio ROS Normal cardiovascular exam     Neuro/Psych negative neurological ROS  negative psych ROS   GI/Hepatic negative GI ROS, Neg liver ROS,,,  Endo/Other  Hypothyroidism    Renal/GU      Musculoskeletal   Abdominal  (+) + obese  Peds  Hematology negative hematology ROS (+)   Anesthesia Other Findings Past Medical History: No date: Blood transfusion without reported diagnosis 2018: History of thyroid  cancer 10/03/2014: Iron deficiency anemia due to dietary causes No date: Osteoarthritis of both knees No date: Pneumonia     Comment:  as a child No date: PONV (postoperative nausea and vomiting)     Comment:  nausea - many years ago No date: Postoperative hypothyroidism No date: Sleep apnea     Comment:  history of prior to surgery No date: Thyroid  disease No date: Wears hearing aid in both ears  Past Surgical History: 12/2014: ABDOMINOPLASTY 09/2015: AUGMENTATION MAMMAPLASTY; Bilateral     Comment:  mastopexy and silicone implants No date: BELPHAROPTOSIS REPAIR 06/2015: BRACHIOPLASTY No date: BREAST CYST ASPIRATION; Right 06/2015: BREAST ENHANCEMENT SURGERY     Comment:  breast lift 03/23/2023: CATARACT EXTRACTION W/PHACO; Left     Comment:  Procedure: CATARACT EXTRACTION PHACO AND INTRAOCULAR               LENS PLACEMENT (IOC) LEFT  3.03  00:32.7;  Surgeon: Myrna Adine Anes, MD;  Location: [facility];                 Service: Ophthalmology;  Laterality: Left; 04/06/2023: CATARACT EXTRACTION W/PHACO; Right     Comment:  Procedure: CATARACT EXTRACTION PHACO AND INTRAOCULAR               LENS PLACEMENT (IOC) RIGHT 3.44 00:34.9;  Surgeon: Myrna Adine Anes, MD;  Location: [facility];                Service: Ophthalmology;  Laterality: Right; 11/08/2015: COLONOSCOPY WITH PROPOFOL ; N/A     Comment:  Procedure: COLONOSCOPY WITH PROPOFOL ;  Surgeon: Rogelia Copping, MD;  Location: [facility];  Service:               Endoscopy;  Laterality: N/A; 09/1996: LAPAROSCOPIC CHOLECYSTECTOMY 03/20/2017: PARATHYROIDECTOMY; 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 03/20/2017: THYROID  LOBECTOMY; Right 12/2013: TOTAL VAGINAL HYSTERECTOMY     Comment:  with bilateral oophorectomy  BMI    Body Mass Index: 35.08 kg/m      Reproductive/Obstetrics negative OB ROS  Anesthesia Physical Anesthesia Plan  ASA: 2  Anesthesia Plan: Spinal   Post-op Pain Management: Regional block* and Ofirmev  IV (intra-op)*   Induction: Intravenous  PONV Risk Score and Plan: 3 and Ondansetron , Dexamethasone , Propofol  infusion, TIVA and Midazolam   Airway Management Planned: Natural Airway and Nasal Cannula  Additional Equipment:   Intra-op Plan:   Post-operative Plan:   Informed Consent: I have reviewed the patients History and Physical, chart, labs and discussed the procedure including the risks, benefits and alternatives for the proposed anesthesia with the patient or authorized representative who has indicated his/her understanding and acceptance.     Dental Advisory Given  Plan Discussed with: Anesthesiologist, CRNA and Surgeon  Anesthesia Plan Comments: (Patient reports no bleeding problems and no anticoagulant use.  Plan for spinal with backup GA )        Anesthesia Quick Evaluation  "

## 2024-05-09 NOTE — H&P (Signed)
 History of Present Illness: The patient is an 74 y.o. female seen in clinic today for follow-up evaluation of her bilateral knees. Patient reports her left total knee replacement is doing fantastic and has no issues. She is recovered on it and feels limited now by her right knee which has pain up to a 5 or 6 out of 10 with activity described as a sharp stabbing pain over the anterior medial aspect of the knee. She reports is worse when going up or down stairs or getting out of a low vehicle. She is avoiding activities and further recovery for her left knee due to her right knee. She is here today to discuss a more permanent solution for her right knee given her success with her left total knee replacement. The patient denies fevers, chills, numbness, tingling, shortness of breath, chest pain, recent illness, or any trauma.  Patient is a non-smoker, nondiabetic with a BMI of 35.9  Past Medical History: Past Medical History:  Diagnosis Date  Bariatric surgery status 10/03/2014  Chronic constipation 10/03/2014  History of thyroid  cancer 02/2017  Diagnosed 02/2017, status post partial thyroidectomy but no HRT or chemotherapy  Iron deficiency anemia due to dietary causes 10/03/2014  Overview: Due to bariatric surgery has multiple vitamin absorption issues.  Living will on file at physician's office 05/14/2023  Obesity (BMI 30.0-34.9)  s/p bariatric surgery  Post-menopause on HRT (hormone replacement therapy) 10/03/2014  S/P TAH-BSO (total abdominal hysterectomy and bilateral salpingo-oophorectomy) 10/03/2014   Past Surgical History: Past Surgical History:  Procedure Laterality Date  CHOLECYSTECTOMY 09/1996  ROUX-EN-Y PROCEDURE 09/2012  HYSTERECTOMY 12/2013  BSO  BRACHIOPLASTY 06/2015  MASTOPEXY BILATERAL 06/2015  TRANSUMBILICAL AUGMENTATION MAMMAPLASTY 09/2015  BLEPHAROPLASTY Bilateral 04/2016  Partial parathyroidectomy Left 03/20/2017  Thyroidectomy Right 03/20/2017  CATARACT EXTRACTION  Left 03/23/2023  Right 04/06/2023  ARTHROPLASTY TOTAL KNEE Left 08/24/2023  By Dr. Lorelle   Past Family History: Family History  Problem Relation Age of Onset  Colon polyps Mother  Hyperlipidemia (Elevated cholesterol) Mother  High blood pressure (Hypertension) Mother  Osteoarthritis Mother  Prostate cancer Father  Skin cancer Father  Hyperlipidemia (Elevated cholesterol) Father  Colon cancer Father  Thyroid  disease Sister  Diabetes type II Sister  Hyperlipidemia (Elevated cholesterol) Sister  Osteoarthritis Maternal Grandmother  Stroke Maternal Grandmother  Colon cancer Paternal Grandmother  Hyperlipidemia (Elevated cholesterol) Paternal Grandmother  Hyperlipidemia (Elevated cholesterol) Paternal Grandfather  High blood pressure (Hypertension) Paternal Grandfather  Stroke Paternal Grandfather   Medications: Current Outpatient Medications  Medication Sig Dispense Refill  acetaminophen  (TYLENOL ) 500 MG tablet Take 1,000 mg by mouth every 8 (eight) hours as needed for Pain  atorvastatin  (LIPITOR) 10 MG tablet Take 1 tablet (10 mg total) by mouth once daily 90 tablet 3  CALCIUM  ORAL Take 2 tablets by mouth once daily  cholecalciferol (VITAMIN D3) 1,000 unit capsule Take 1,000 Units by mouth once daily  cyanocobalamin (VITAMIN B12) 500 MCG tablet Take 500 mcg by mouth every other day  estradiol  (ESTRACE ) 1 MG tablet Take 1 mg by mouth once daily  levothyroxine  (SYNTHROID ) 100 MCG tablet Take 1 tablet (100 mcg total) by mouth once daily Take on an empty stomach with a glass of water  at least 30-60 minutes before breakfast. 90 tablet 3  multivitamin capsule Take 1 capsule by mouth once daily Spectra  vit   No current facility-administered medications for this visit.   Allergies: No Known Allergies   Visit Vitals: Vitals:  04/12/24 1002  BP: 120/70    Review of Systems:  A comprehensive 14 point ROS was performed, reviewed, and the pertinent orthopaedic findings are  documented in the HPI.  Physical Exam: General/Constitutional: No apparent distress: well-nourished and well developed. Eyes: Pupils equal, round with synchronous movement. Pulmonary exam: Lungs clear to auscultation bilaterally no wheezing rales or rhonchi Cardiac exam: Regular rate and rhythm no obvious murmurs rubs or gallops. Integumentary: No impressive skin lesions present, except as noted in detailed exam. Neuro/Psych: Normal mood and affect, oriented to person, place and time.  Comprehensive Knee Exam: Gait Non-antalgic and fluid  Alignment Neutral   Inspection Right Left  Skin Normal appearance with no obvious deformity. No ecchymosis or erythema. Normal appearance with no obvious deformity. Healed midline incision no ecchymosis or erythema.  Soft Tissue No focal soft tissue swelling No focal soft tissue swelling  Quad Atrophy None None   Palpation  Right Left  Tenderness Medial joint line parapatellar tenderness to palpation No peripatellar, patellar tendon, quad tendon, medial/lateral joint line pain  Crepitus + Patellofemoral crepitus No patellofemoral or tibiofemoral crepitus  Effusion None None   Range of Motion Right Left  Flexion 0-115 0-120  Extension Full knee extension without hyperextension Full knee extension without hyperextension   Ligamentous Exam Right Left  Lachman Normal Normal  Valgus 0 Normal Normal  Valgus 30 Normal Normal  Varus 0 Normal Normal  Varus 30 Normal Normal  Anterior Drawer Normal Normal  Posterior Drawer Normal Normal   Meniscal Exam Right  Hyperflexion Test Negative  Hyperextension Test Positive  McMurray's Positive    Neurovascular Right Left  Quadriceps Strength 5/5 5/5  Hamstring Strength 5/5 5/5  Hip Abductor Strength 4/5 4/5  Distal Motor Normal Normal  Distal Sensory Normal light touch sensation Normal light touch sensation  Distal Pulses Normal Normal    Imaging Studies: I reviewed AP, lateral, sunrise  x-rays of the left knee which show status post total knee arthroplasty with patellar resurfacing with components in appropriate. Position with no evidence of periprosthetic fracture or loosening. AP and sunrise of the right knee and previous lateral of the right knee images reviewed today which show tricompartmental degenerative changes with medial joint space narrowing with sclerosis, osteophyte formation and patellofemoral bone-on-bone articulation with cystic changes. Kellgren-Lawrence grade 3/4. No fractures noted.  Assessment:  ICD-10-CM  1. 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 Based upon the patient's continued symptoms and failure to respond to conservative treatment, I have recommended a right total knee replacement for this patient. A long discussion took place with the patient describing what a total joint replacement is and what the procedure would entail. A knee model, similar to the implants that will be used during the operation, was utilized to demonstrate the implants. Choices of implant manufactures were discussed and reviewed. The ability to secure the implant utilizing cement or cementless (press fit) fixation was discussed. The approach and exposure was discussed.   The hospitalization and post-operative care and rehabilitation were also discussed. The use of perioperative antibiotics and DVT prophylaxis were discussed. The risk, benefits and alternatives to a surgical intervention were discussed at length with the patient. The patient was also advised of risks related to the medical comorbidities and elevated body mass index (BMI). A lengthy discussion took place to review the most common complications including but not limited to: stiffness, loss of function, complex regional pain syndrome, deep vein thrombosis, pulmonary embolus, heart attack, stroke, infection,  wound breakdown,  numbness, intraoperative fracture, damage to nerves, tendon,muscles, arteries or other blood vessels, death and other possible complications from anesthesia. The patient was told that we will take steps to minimize these risks by using sterile technique, antibiotics and DVT prophylaxis when appropriate and follow the patient postoperatively in the office setting to monitor progress. The possibility of recurrent pain, no improvement in pain and actual worsening of pain were also discussed with the patient.   Patient asked about and confirms no history of any reactions to metal or metal allergy in the past.  The discharge plan of care focused on the patient going home following surgery. The patient was encouraged to make the necessary arrangements to have someone stay with them when they are discharged home.   The benefits of surgery were discussed with the patient including the potential for improving the patient's current clinical condition through operative intervention. Alternatives to surgical intervention including continued conservative management were also discussed in detail. All questions were answered to the satisfaction of the patient. The patient participated and agreed to the plan of care as well as the use of the recommended implants for their total knee replacement surgery. An information packet was given to the patient to review prior to surgery.   The patient received clearance for surgery. All questions answered the patient agrees to the above plan made preparations for a right total knee replacement.  Portions of this record have been created using Scientist, clinical (histocompatibility and immunogenetics). Dictation errors have been sought, but may not have been identified and corrected.  Arthea Sheer MD

## 2024-05-09 NOTE — Progress Notes (Signed)
 Patient is not able to walk the distance required to go the bathroom, or she is unable to safely negotiate stairs required to access the bathroom.  A 3in1 BSC will alleviate this problem.       Lollie Marrow, PA-C [facility]

## 2024-05-09 NOTE — Evaluation (Signed)
 Physical Therapy Evaluation Patient Details Name: Tracey Jensen MRN: [id number] DOB: 08/12/1950 Today's Date: 05/09/2024  History of Present Illness  admitted for acute hospitalization s/p R TKR (05/10/23), WBAT  Clinical Impression  Patient resting in bed upon arrival to room; alert and oriented, follows commands and agreeable to participation with session.  Endorses good pain control to R LE (FACES 0-2/10); meds received prior to session.  Mild residual weakness to bilat LEs, L > R, post-spinal, requiring increased time/effort for activation, coordination and movement throughout full ROM. Currently requiring min assist for bed mobility; min assist for sit/stand and static standing balance with RW; min/mod assist +2 for safety/balance with SPT.  Demonstrates narrowed BOS, moderate reliance on bilat UEs with lift off.  Fair/good WBing and overall stability in static stance, but noted difficulty with advancement/placement when stepping.  Cuing for full hip/knee extension in closed-chain position, min/mod assist for balance due to excessive lateral sway with dynamic mobility.  Additional gait deferred until spinal fully resolved and strength/stability optimized. Would benefit from skilled PT to address above deficits and promote optimal return to PLOF.; recommend post-acute PT follow up as indicated by interdisciplinary care team.          If plan is discharge home, recommend the following: A little help with walking and/or transfers;A little help with bathing/dressing/bathroom   Can travel by private vehicle        Equipment Recommendations None recommended by PT  Recommendations for Other Services       Functional Status Assessment Patient has had a recent decline in their functional status and demonstrates the ability to make significant improvements in function in a reasonable and predictable amount of time.     Precautions / Restrictions Precautions Precautions:  Fall Restrictions Weight Bearing Restrictions Per Provider Order: Yes RLE Weight Bearing Per Provider Order: Weight bearing as tolerated      Mobility  Bed Mobility Overal bed mobility: Needs Assistance Bed Mobility: Supine to Sit     Supine to sit: Min assist          Transfers Overall transfer level: Needs assistance Equipment used: Rolling walker (2 wheels) Transfers: Sit to/from Stand, Bed to chair/wheelchair/BSC Sit to Stand: Min assist, +2 safety/equipment Stand pivot transfers: Min assist, +2 safety/equipment, Mod assist         General transfer comment: narrowed BOS, moderate reliance on bilat UEs with lift off.  Fair/good WBing and overall stability in static stance, but noted difficulty with advancement/placement when stepping.  Cuing for full hip/knee extension in closed-chain position, min/mod assist for balance due to excessive lateral sway with dynamic mobility.    Ambulation/Gait               General Gait Details: deferred due to residual spinal, generalized instability in standing  Stairs            Wheelchair Mobility     Tilt Bed    Modified Rankin (Stroke Patients Only)       Balance Overall balance assessment: Needs assistance Sitting-balance support: No upper extremity supported, Feet supported Sitting balance-Leahy Scale: Good     Standing balance support: Bilateral upper extremity supported Standing balance-Leahy Scale: Poor                               Pertinent Vitals/Pain Pain Assessment Pain Assessment: Faces Faces Pain Scale: Hurts a little bit Pain Location: R knee Pain Descriptors / Indicators:  Aching Pain Intervention(s): Limited activity within patient's tolerance, Monitored during session, Repositioned, Premedicated before session    Home Living Family/patient expects to be discharged to:: Private residence Living Arrangements: Spouse/significant other Available Help at Discharge:  Family;Available 24 hours/day Type of Home: House Home Access: Stairs to enter Entrance Stairs-Rails: None Entrance Stairs-Number of Steps: 1   Home Layout: One level        Prior Function Prior Level of Function : Independent/Modified Independent;Driving             Mobility Comments: Indep with ADLs, household and community mobilization without assist device; denies fall history.       Extremity/Trunk Assessment   Upper Extremity Assessment Upper Extremity Assessment: Overall WFL for tasks assessed    Lower Extremity Assessment Lower Extremity Assessment:  (R hip and ankle grossly 4-/5, knee grossly 3-/5; L LE grossly 3-/5.  Mild residual spinal deficits noted.)       Communication   Communication Communication: No apparent difficulties    Cognition Arousal: Alert Behavior During Therapy: WFL for tasks assessed/performed   PT - Cognitive impairments: No apparent impairments                         Following commands: Intact       Cueing Cueing Techniques: Verbal cues     General Comments      Exercises Total Joint Exercises Goniometric ROM: R LE: 5-90 degrees Other Exercises Other Exercises: Supine LE therex, 1x10, active ROM: ankle pumps, quad sets, SAQs, heel slides, hip abduct/adduct and SLR.  Increased strength/movement or R LE compared to L LE.   Assessment/Plan    PT Assessment Patient needs continued PT services  PT Problem List Decreased strength;Decreased range of motion;Decreased balance;Decreased mobility;Decreased activity tolerance;Decreased knowledge of use of DME;Decreased knowledge of precautions;Decreased safety awareness;Decreased coordination       PT Treatment Interventions DME instruction;Gait training;Stair training;Therapeutic activities;Therapeutic exercise;Functional mobility training;Balance training;Cognitive remediation;Patient/family education    PT Goals (Current goals can be found in the Care Plan section)   Acute Rehab PT Goals Patient Stated Goal: to return home PT Goal Formulation: With patient Time For Goal Achievement: 05/23/24 Potential to Achieve Goals: Good    Frequency BID     Co-evaluation               AM-PAC PT 6 Clicks Mobility  Outcome Measure Help needed turning from your back to your side while in a flat bed without using bedrails?: None Help needed moving from lying on your back to sitting on the side of a flat bed without using bedrails?: A Little Help needed moving to and from a bed to a chair (including a wheelchair)?: A Little Help needed standing up from a chair using your arms (e.g., wheelchair or bedside chair)?: A Little Help needed to walk in hospital room?: A Lot Help needed climbing 3-5 steps with a railing? : A Lot 6 Click Score: 17    End of Session Equipment Utilized During Treatment: Gait belt Activity Tolerance: Patient tolerated treatment well Patient left: in chair;with call bell/phone within reach Nurse Communication: Mobility status PT Visit Diagnosis: Muscle weakness (generalized) (M62.81);Difficulty in walking, not elsewhere classified (R26.2)    Time: 8395-8375 PT Time Calculation (min) (ACUTE ONLY): 20 min   Charges:   PT Evaluation $PT Eval Moderate Complexity: 1 Mod PT Treatments $Therapeutic Exercise: 8-22 mins PT General Charges $$ ACUTE PT VISIT: 1 Visit  Deondrea Aguado H. Delores, PT, DPT, NCS 05/09/2024, 5:30 PM [phone]

## 2024-05-09 NOTE — Anesthesia Procedure Notes (Signed)
 Spinal  Patient location during procedure: OR Start time: 05/09/2024 10:00 AM End time: 05/09/2024 10:07 AM Reason for block: surgical anesthesia  Staffing Performed: other anesthesia staff and resident/CRNA  Authorized by: Vicci Camellia Glatter, MD   Performed by: Lennie Lamarr HERO, CRNA  Preanesthetic Checklist Completed: patient identified, IV checked, site marked, risks and benefits discussed, surgical consent, monitors and equipment checked, pre-op evaluation and timeout performed Spinal Block Patient position: sitting Prep: ChloraPrep and DuraPrep Patient monitoring: heart rate, cardiac monitor, continuous pulse ox and blood pressure Approach: midline Location: L3-4 Injection technique: single-shot Needle Needle type: Pencan  Needle gauge: 24 G Needle length: 10 cm Needle insertion depth (cm): 9 Assessment Sensory level: T10 Events: CSF return  Additional Notes IV functioning, monitors applied to pt. Expiration date of kit checked and confirmed to be in date. Sterile prep and drape, hand hygiene and sterile gloved used. Pt was positioned and spine was prepped in sterile fashion. Skin was anesthetized with lidocaine . Free flow of clear CSF obtained prior to injecting local anesthetic into CSF x 1 attempt. Spinal needle aspirated freely following injection. Needle was carefully withdrawn, and pt tolerated procedure well. Loss of motor and sensory on exam post injection.

## 2024-05-09 NOTE — Discharge Summary (Incomplete)
 Physician Discharge Summary  Patient ID: Tracey Jensen MRN: [id number] DOB/AGE: 1951-04-20 74 y.o.  Admit date: 05/09/2024 Discharge date: 05/10/2024  Admission Diagnoses:  Primary osteoarthritis of right knee [M17.11] S/P total knee arthroplasty, right [Z96.651]   Discharge Diagnoses: Patient Active Problem List   Diagnosis Date Noted   S/P total knee arthroplasty, right 05/09/2024   S/P TKR (total knee replacement), left 08/24/2023   Special screening for malignant neoplasms, colon    Chronic constipation 10/03/2014   Post-menopause on HRT (hormone replacement therapy) 10/03/2014   S/P TAH-BSO (total abdominal hysterectomy and bilateral salpingo-oophorectomy) 10/03/2014   Bariatric surgery status 10/03/2014   Iron deficiency anemia due to dietary causes 10/03/2014    Past Medical History:  Diagnosis Date   Blood transfusion without reported diagnosis    History of thyroid  cancer 2018   Iron deficiency anemia due to dietary causes 10/03/2014   Osteoarthritis of both knees    Pneumonia    as a child   PONV (postoperative nausea and vomiting)    nausea - many years ago   Postoperative hypothyroidism    Primary osteoarthritis of right knee    Sleep apnea    history of prior to surgery   Thyroid  disease    Wears hearing aid in both ears      Transfusion: none   Consultants (if any):   Discharged Condition: Improved  Hospital Course: APRILE DICKENSON is an 74 y.o. female who was admitted 05/09/2024 with a diagnosis of S/P total knee arthroplasty, right and went to the operating room on 05/09/2024 and underwent the above named procedures.    Surgeries: Procedures: ARTHROPLASTY, KNEE, TOTAL on 05/09/2024 Patient tolerated the surgery well. Taken to PACU where she was stabilized and then transferred to the orthopedic floor.  Started on Lovenox  30 mg q 12 hrs. TEDs and SCDs applied bilaterally. Heels elevated on bed. No evidence of DVT. Negative Homan. Physical therapy started  on day #1 for gait training and transfer. OT started day #1 for ADL and assisted devices.  Patient's IV was d/c on day #1. Patient was able to safely and independently complete all PT goals. PT recommending discharge to home.    On post op day #1 patient was stable and ready for discharge to home with HHPT.  Implants:  Femur: Persona Size 8 CR PPS   Tibia: Persona Size E OsseoTi  Poly: 10mm MC  Patella: 32 x9 mm symmetric OsseoTi   She was given perioperative antibiotics:  Anti-infectives (From admission, onward)    Start     Dose/Rate Route Frequency Ordered Stop   05/09/24 1630  ceFAZolin  (ANCEF ) IVPB 2g/100 mL premix        2 g 200 mL/hr over 30 Minutes Intravenous Every 6 hours 05/09/24 1436 05/10/24 0429   05/09/24 0830  ceFAZolin  (ANCEF ) IVPB 2g/100 mL premix        2 g 200 mL/hr over 30 Minutes Intravenous On call to O.R. 05/09/24 9180 05/09/24 1015     .  She was given sequential compression devices, early ambulation, and Lovenox  TEDs for DVT prophylaxis.  She benefited maximally from the hospital stay and there were no complications.    Recent vital signs:  Vitals:   05/09/24 1430 05/09/24 1444  BP: 117/68 132/68  Pulse: (!) 58 (!) 57  Resp:  16  Temp:  (!) 97.3 F (36.3 C)  SpO2: 98% 98%    Recent laboratory studies:  Lab Results  Component Value Date  HGB 11.8 (L) 05/02/2024   HGB 11.0 (L) 08/25/2023   HGB 12.4 08/11/2023   Lab Results  Component Value Date   WBC 4.5 05/02/2024   PLT 222 05/02/2024   Lab Results  Component Value Date   INR 1.0 07/07/2012   Lab Results  Component Value Date   NA 141 05/02/2024   K 4.5 05/02/2024   CL 107 05/02/2024   CO2 26 05/02/2024   BUN 19 05/02/2024   CREATININE 0.63 05/02/2024   GLUCOSE 94 05/02/2024    Discharge Medications:   Allergies as of 05/09/2024   No Known Allergies      Medication List     TAKE these medications    acetaminophen  500 MG tablet Commonly known as: TYLENOL  Take 2  tablets (1,000 mg total) by mouth every 8 (eight) hours. What changed:  when to take this reasons to take this   atorvastatin  10 MG tablet Commonly known as: LIPITOR Take 10 mg by mouth every evening.   CALCIUM  500 +D PO Take 1 tablet by mouth daily.   cholecalciferol 25 MCG (1000 UNIT) tablet Commonly known as: VITAMIN D3 Take 1,000 Units by mouth at bedtime.   cyanocobalamin 1000 MCG tablet Commonly known as: VITAMIN B12 Take 1,000 mcg by mouth at bedtime.   docusate sodium  100 MG capsule Commonly known as: COLACE Take 1 capsule (100 mg total) by mouth 2 (two) times daily.   enoxaparin  40 MG/0.4ML injection Commonly known as: LOVENOX  Inject 0.4 mLs (40 mg total) into the skin daily.   estradiol  1 MG tablet Commonly known as: ESTRACE  Take 1 tablet (1 mg total) by mouth daily. What changed: when to take this   levothyroxine  100 MCG tablet Commonly known as: SYNTHROID  Take 100 mcg by mouth daily before breakfast.   multivitamin capsule Take 1 capsule by mouth at bedtime.   ondansetron  4 MG tablet Commonly known as: ZOFRAN  Take 1 tablet (4 mg total) by mouth every 6 (six) hours as needed for nausea.   oxyCODONE  5 MG immediate release tablet Commonly known as: Roxicodone  Take 1 tablet (5 mg total) by mouth every 6 (six) hours as needed for breakthrough pain.   traMADol  50 MG tablet Commonly known as: ULTRAM  Take 1 tablet (50 mg total) by mouth every 6 (six) hours as needed for moderate pain (pain score 4-6).               Durable Medical Equipment  (From admission, onward)           Start     Ordered   05/09/24 1224  For home use only DME Walker rolling  Once       Question Answer Comment  Walker: With 5 Inch Wheels   Patient needs a walker to treat with the following condition Total knee replacement status, right      05/09/24 1223   05/09/24 1224  For home use only DME 3 n 1  Once        05/09/24 1223            Diagnostic Studies: No  results found.  Disposition:      Follow-up Information     Charlene Debby BROCKS, PA-C Follow up in 2 week(s).   Specialties: Orthopedic Surgery, Emergency Medicine Contact information: [address] [place] [address] [phone]                  Signed: Debby BROCKS Charlene 05/09/2024, 3:18 PM

## 2024-05-09 NOTE — Progress Notes (Signed)
 Patient has mobility impairment for daily activities. A rolling walker will resolve this and the patient is safe to use it    T. Medford Amber, PA-C [facility]

## 2024-05-09 NOTE — Plan of Care (Signed)
  Problem: Activity: Goal: Ability to avoid complications of mobility impairment will improve Outcome: Progressing Goal: Range of joint motion will improve Outcome: Progressing   Problem: Clinical Measurements: Goal: Postoperative complications will be avoided or minimized Outcome: Progressing   Problem: Pain Management: Goal: Pain level will decrease with appropriate interventions Outcome: Progressing   Problem: Skin Integrity: Goal: Will show signs of wound healing Outcome: Progressing   

## 2024-05-09 NOTE — Transfer of Care (Signed)
 Immediate Anesthesia Transfer of Care Note  Patient: Tracey Jensen  Procedure(s) Performed: ARTHROPLASTY, KNEE, TOTAL (Right: Knee)  Patient Location: PACU  Anesthesia Type:Spinal  Level of Consciousness: drowsy and patient cooperative  Airway & Oxygen Therapy: Patient Spontanous Breathing  Post-op Assessment: Report given to RN and Post -op Vital signs reviewed and stable  Post vital signs: Reviewed and stable  Last Vitals:  Vitals Value Taken Time  BP 120/64 05/09/24 12:10  Temp    Pulse 67 05/09/24 12:12  Resp 16 05/09/24 12:12  SpO2 98 % 05/09/24 12:12  Vitals shown include unfiled device data.  Last Pain:  Vitals:   05/09/24 0842  TempSrc: Temporal  PainSc: 0-No pain         Complications: No notable events documented.

## 2024-05-09 NOTE — Op Note (Signed)
 Patient Name: Tracey Jensen  FMW:[id number]  Pre-Operative Diagnosis: Right knee Osteoarthritis  Post-Operative Diagnosis: (same)  Procedure: Right Total Knee Arthroplasty  Components/Implants: Femur: Persona Size 8 CR PPS   Tibia: Persona Size E OsseoTi  Poly: 10mm MC  Patella: 32 x9 mm symmetric OsseoTi  Femoral Valgus Cut Angle: 5 degrees  Distal Femoral Re-cut: none  Patella Resurfacing: yes   Date of Surgery: 05/10/2023  Surgeon: Arthea Sheer MD  Assistant: Debby Amber PA (present and scrubbed throughout the case, critical for assistance with exposure, retraction, instrumentation, and closure)   Anesthesiologist: Vicci  Anesthesia: Spinal   Tourniquet Time: 49 min  EBL: 25cc  IVF: 700cc  Complications: None   Brief history: The patient is a 74 year old female with a history of osteoarthritis of the right knee with pain limiting their range of motion and activities of daily living, which has failed multiple attempts at conservative therapy.  The risks and benefits of total knee arthroplasty as definitive surgical treatment were discussed with the patient, who opted to proceed with the operation.  After outpatient medical clearance and optimization was completed the patient was admitted to [facility] for the procedure.  All preoperative films were reviewed and an appropriate surgical plan was made prior to surgery. Preoperative range of motion was 0 to 115.   Description of procedure: The patient was brought to the operating room where laterality was confirmed by all those present to be the right side.   Spinal anesthesia was administered and the patient received an intravenous dose of antibiotics for surgical prophylaxis and a dose of tranexamic acid .  Patient is positioned supine on the operating room table with all bony prominences well-padded.  A well-padded tourniquet was applied to the right thigh.  The knee was then prepped and draped in  usual sterile fashion with multiple layers of adhesive and nonadhesive drapes.  All of those present in the operating room participated in a surgical timeout laterality and patient were confirmed.   An Esmarch was wrapped around the extremity and the leg was elevated and the knee flexed.  The tourniquet was inflated to a pressure of 275 mmHg. The Esmarch was removed and the leg was brought down to full extension.  The patella and tibial tubercle identified and outlined using a marking pen and a midline skin incision was made with a knife carried through the subcutaneous tissue down to the extensor retinaculum.  After exposure of the extensor mechanism the medial parapatellar arthrotomy was performed with a scalpel and electrocautery extending down medial and distal to the tibial tubercle taking care to avoid incising the patellar tendon.   A standard medial release was performed over the proximal tibia.  The knee was brought into extension in order to excise the fat pad taking care not to damage the patella tendon.  The superior soft tissue was removed from the anterior surface of the distal femur to visualize for the procedure.  The knee was then brought into flexion with the patella subluxed laterally and subluxing the tibia anteriorly.  The ACL was transected and removed with electrocautery and additional soft tissue was removed from the proximal surface of the tibia to fully expose. The PCL was found to be intact and was preserved.  An extramedullary tibial cutting guide was then applied to the leg with a spring-loaded ankle clamp placed around the distal tibia just above the malleoli the angulation of the guide was adjusted to give some posterior slope in the tibial resection  with an appropriate varus/valgus alignment.  The resection guide was then pinned to the proximal tibia and the proximal tibial surface was resected with an oscillating saw.  Careful attention was paid to ensure the blade did not  disrupt any of the soft tissues including any lateral or medial ligament.  Attention was then turned to the femur, with the knee slightly flexed a opening drill was used to enter the medullary canal of the femur.  After removing the drill marrow was suctioned out to decompress the distal femur.  An intramedullary femoral guide was then inserted into the drill hole and the alignment guide was seated firmly against the distal end of the medial femoral condyle.  The distal femoral cutting guide was then attached and pinned securely to the anterior surface of the femur and the intramedullary rod and alignment guide was removed.  Distal femur resection was then performed with an oscillating saw with retractors protecting medial and laterally.   The distal cutting block was then removed and the extension gap was checked with a spacer.  Extension gap was found to be appropriately sized to accommodate the spacer block.   The femoral sizing guide was then placed securely into the posterior condyles of the femur and the femoral size was measured and determined to be 8.  The size 8; 4-in-1 cutting guide was placed in position and secured with 2 pins.  The anterior posterior and chamfer resections were then performed with an oscillating saw.  Bony fragments and osteophytes were then removed.  Using a lamina spreader the posterior medial and lateral condyles were checked for additional osteophytes and posterior soft tissue remnants.  Any remaining meniscus was removed at this time.  Periarticular injection was performed in the meniscal rims and posterior capsule with aspiration performed to ensure no intravascular injection.   The tibia was then exposed and the tibial trial was pinned onto the plateau after confirming appropriate orientation and rotation.  Using the drill bushing the tibia was prepared to the appropriate drill depth.  Tibial broach impactor was then driven through the punch guide using a mallet.  The  femoral trial component was then inserted onto the femur.  A trial tibial polyethylene bearing was then placed and the knee was reduced.  The knee achieved full extension with no hyperextension and was found to be balanced in flexion and extension with the trials in place.  The knee was then brought into full extension the patella was everted and held with 2 Kocher clamps.  The articular surface of the patella was then resected with an patella reamer and saw after careful measurement with a caliper.  The patella was then prepared with the drill guide and a trial patella was placed.  The knee was then taken through range of motion and it was found that the patella articulated appropriately with the trochlea and good patellofemoral motion without subluxation.    The correct final components for implantation were confirmed and opened by the circulator nurse.  The knee was irrigated with normal saline via pulsatile lavage to remove any bony debris or soft tissue.  The prepared surface of the tibia was exposed and the tibial component was implanted with good bony contact.  The femoral component was then placed and impacted showing good coverage and a good snug fit.  The patella was then cleared off and the patella compression tool was used to apply the patellar component with symmetric compression onto the patella.  The tibial component was then  irrigated and cleared of any debris and a real polyethylene component was placed and engaged with the locking mechanism.  The knee was then injected with the particular cocktail.  The knee was taken through range of motion and found to be stable in flexion and extension with patellar tracking.  The knee was then irrigated with copious amount of normal saline via pulsatile lavage to remove all loose bodies and other debris.  The knee was then irrigated with surgiphor betadine based wash and reirrigated with saline.  The tourniquet was then dropped and all bleeding vessels were  identified and coagulated.  The arthrotomy was approximated with #1 Vicryl and closed with #1 Stratafix suture.  The knee was brought into slight flexion and the subcutaneous tissues were closed with 0 Vicryl, 2-0 Vicryl and a running subcuticular 4-0 stratafix barbed suture.  Skin was then glued with Dermabond.  A sterile adhesive dressing was then placed along with a sequential compression device to the calf, a Ted stocking, and a cryotherapy cuff.   Sponge, needle, and Lap counts were all correct at the end of the case.   The patient was transferred off of the operating room table to a hospital bed, good pulses were found distally on the operative side.  The patient was transferred to the recovery room in stable condition.

## 2024-05-09 NOTE — TOC Transition Note (Signed)
 Transition of Care [facility]) - Discharge Note   Patient Details  Name: Tracey Jensen MRN: [id number] Date of Birth: [birth year]  Transition of Care [facility]) CM/SW Contact:  Victory Jackquline RAMAN, RN Phone Number: 05/09/2024, 3:42 PM   Clinical Narrative:    Patient discharging home with [facility] for PT/OT that was arranged by Dr. Mal office. No DME need. Patient has DME at home from prior surgery.  Patient's spouse will be picking her up at the time of discharge. No further concerns. RNCM signing off.  Final next level of care: Home w Home Health Services Barriers to Discharge: No Barriers Identified   Patient Goals and CMS Choice            Discharge Placement                Patient to be transferred to facility by: Spouse Name of family member notified: Ubaldo Patient and family notified of of transfer: 05/09/24  Discharge Plan and Services Additional resources added to the After Visit Summary for                                    Representative spoke with at [facility] Agency: HH/PT set up by MD's office prior to surgery. ([facility])  Social Drivers of Health (SDOH) Interventions SDOH Screenings   Food Insecurity: No Food Insecurity (05/09/2024)  Housing: Low Risk (05/09/2024)  Transportation Needs: No Transportation Needs (05/09/2024)  Utilities: Not At Risk (05/09/2024)  Financial Resource Strain: Low Risk  (04/12/2024)   Received from [facility]  Social Connections: Socially Integrated (05/09/2024)  Tobacco Use: Medium Risk (05/09/2024)     Readmission Risk Interventions     No data to display

## 2024-05-10 ENCOUNTER — Other Ambulatory Visit: Payer: Self-pay

## 2024-05-10 ENCOUNTER — Encounter: Payer: Self-pay | Admitting: Orthopedic Surgery

## 2024-05-10 DIAGNOSIS — M1711 Unilateral primary osteoarthritis, right knee: Secondary | ICD-10-CM | POA: Diagnosis not present

## 2024-05-10 LAB — CBC
HCT: 32.8 % — ABNORMAL LOW (ref 36.0–46.0)
Hemoglobin: 10.8 g/dL — ABNORMAL LOW (ref 12.0–15.0)
MCH: 30.9 pg (ref 26.0–34.0)
MCHC: 32.9 g/dL (ref 30.0–36.0)
MCV: 94 fL (ref 80.0–100.0)
Platelets: 207 K/uL (ref 150–400)
RBC: 3.49 MIL/uL — ABNORMAL LOW (ref 3.87–5.11)
RDW: 13.2 % (ref 11.5–15.5)
WBC: 8.3 K/uL (ref 4.0–10.5)
nRBC: 0 % (ref 0.0–0.2)

## 2024-05-10 LAB — BASIC METABOLIC PANEL WITH GFR
Anion gap: 7 (ref 5–15)
BUN: 19 mg/dL (ref 8–23)
CO2: 25 mmol/L (ref 22–32)
Calcium: 8.9 mg/dL (ref 8.9–10.3)
Chloride: 108 mmol/L (ref 98–111)
Creatinine, Ser: 0.8 mg/dL (ref 0.44–1.00)
GFR, Estimated: >60 mL/min
Glucose, Bld: 105 mg/dL — ABNORMAL HIGH (ref 70–99)
Potassium: 4.3 mmol/L (ref 3.5–5.1)
Sodium: 140 mmol/L (ref 135–145)

## 2024-05-10 MED ORDER — CELECOXIB 200 MG PO CAPS
200.0000 mg | ORAL_CAPSULE | Freq: Two times a day (BID) | ORAL | 0 refills | Status: AC
  Filled 2024-05-10: qty 28, 14d supply, fill #0

## 2024-05-10 MED ORDER — SODIUM CHLORIDE 0.9 % IV BOLUS
500.0000 mL | Freq: Once | INTRAVENOUS | Status: AC
  Administered 2024-05-10: 500 mL via INTRAVENOUS

## 2024-05-10 NOTE — Progress Notes (Signed)
" ° °  Subjective: 1 Day Post-Op Procedures (LRB): ARTHROPLASTY, KNEE, TOTAL (Right) Patient reports pain as mild.   Patient is well, and has had no acute complaints or problems Denies any CP, SOB, ABD pain. We will continue therapy today.  Plan is to go Home after hospital stay.  Objective: Vital signs in last 24 hours: Temp:  [96.8 F (36 C)-98 F (36.7 C)] 98 F (36.7 C) (01/13 0735) Pulse Rate:  [55-76] 65 (01/13 0735) Resp:  [11-19] 17 (01/13 0735) BP: (98-140)/(50-94) 98/50 (01/13 0735) SpO2:  [94 %-100 %] 94 % (01/13 0735) Weight:  [102.1 kg] 102.1 kg (01/12 0842)  Intake/Output from previous day: 01/12 0701 - 01/13 0700 In: 1997.4 [P.O.:720; I.V.:1110.9; IV Piggyback:166.6] Out: 1150 [Urine:1100; Blood:50] Intake/Output this shift: No intake/output data recorded.  Recent Labs    05/10/24 0622  HGB 10.8*   Recent Labs    05/10/24 0622  WBC 8.3  RBC 3.49*  HCT 32.8*  PLT 207   Recent Labs    05/10/24 0622  NA 140  K 4.3  CL 108  CO2 25  BUN 19  CREATININE 0.80  GLUCOSE 105*  CALCIUM  8.9   No results for input(s): LABPT, INR in the last 72 hours.  EXAM General - Patient is Alert, Appropriate, and Oriented Extremity - Neurovascular intact Sensation intact distally Intact pulses distally Dorsiflexion/Plantar flexion intact Dressing - dressing C/D/I and no drainage Motor Function - intact, moving foot and toes well on exam.   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   Weight as of this encounter: 102.1 kg. Advance diet Up with therapy Pain well controlled VSS, BP soft, asymptomatic with ambulation in room. Give 500 cc bolus NS Labs stable CM to assist with discharge to home with HHPT  DVT Prophylaxis - Lovenox , TED hose, and SCDs Weight-Bearing as tolerated to right leg   T. Medford Amber, PA-C [facility] 05/10/2024, 8:15 AM   "

## 2024-05-10 NOTE — Plan of Care (Signed)
" °  Problem: Urinary Elimination: Goal: Will remain free from infection Outcome: Progressing   "

## 2024-05-10 NOTE — Progress Notes (Signed)
 DISCHARGE NOTE:   Pt dc with IV removed and dc instructions given. Pt received medications delivered to hospital room. Pt educated on at home use of Lovenox  injection and pt given a sharps container for safe disposable  of the needle. Pt voices no questions or concerns at this time. Pt wheeled down to medical mall entrance by staff. Pt's husband provided transportation.

## 2024-05-10 NOTE — Progress Notes (Signed)
 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  BP 110/59 in sitting, 98/68 in standing with no c/o of dizziness or light headedness.  She is able to complete supine and seated HEP and general education for post op and safety. She is able to walk 100' with RW and cga with no LOB or buckling and reported feeling safe with gait.  Declined stairs as she only has one small slab threshold step to enter home and feels good about navigating it.  Reviewed proper sequencing.  Pt with no further questions or concerns voiced.    If plan is discharge home, recommend the following: A little help with walking and/or transfers;A little help with bathing/dressing/bathroom   Can travel by private vehicle        Equipment Recommendations  None recommended by PT    Recommendations for Other Services       Precautions / Restrictions Precautions Precautions: Fall Restrictions Weight Bearing Restrictions Per Provider Order: Yes RLE Weight Bearing Per Provider Order: Weight bearing as tolerated     Mobility  Bed Mobility Overal bed mobility: Needs Assistance Bed Mobility: Supine to Sit     Supine to sit: Used rails, Contact guard       Patient Response: Cooperative  Transfers Overall transfer level: Needs assistance Equipment used: Rolling walker (2 wheels) Transfers: Sit to/from Stand Sit to Stand: Contact guard assist                Ambulation/Gait Ambulation/Gait assistance: Contact guard assist Gait Distance (Feet): 100 Feet Assistive device: Rolling walker (2 wheels) Gait Pattern/deviations: Step-through pattern, Decreased stance time - right Gait velocity: dec     General Gait Details: overal does well wit no  LOB or buckles, declined stairs - only has  small slab threshold   Stairs             Wheelchair Mobility     Tilt Bed Tilt Bed Patient Response: Cooperative  Modified Rankin (Stroke Patients Only)       Balance Overall balance assessment: Needs assistance Sitting-balance support: Feet supported Sitting balance-Leahy Scale: Good     Standing balance support: Bilateral upper extremity supported Standing balance-Leahy Scale: Good                              Communication Communication Communication: No apparent difficulties  Cognition Arousal: Alert Behavior During Therapy: WFL for tasks assessed/performed   PT - Cognitive impairments: No apparent impairments                         Following commands: Intact      Cueing Cueing Techniques: Verbal cues  Exercises Other Exercises Other Exercises: HEP demo and review in supine and sitting with good recall.  had TKR prior on L    General Comments        Pertinent Vitals/Pain Pain Assessment Pain Assessment: Faces Faces Pain Scale: Hurts little more Pain Location: R knee Pain Descriptors / Indicators: Aching Pain Intervention(s): Limited activity within patient's tolerance, Monitored during session, Repositioned, Ice applied    Home Living  Prior Function            PT Goals (current goals can now be found in the care plan section) Progress towards PT goals: Progressing toward goals    Frequency    BID      PT Plan      Co-evaluation              AM-PAC PT 6 Clicks Mobility   Outcome Measure  Help needed turning from your back to your side while in a flat bed without using bedrails?: None Help needed moving from lying on your back to sitting on the side of a flat bed without using bedrails?: A Little Help needed moving to and from a bed to a chair (including a wheelchair)?: A Little Help needed standing up from a chair using your arms (e.g., wheelchair or bedside  chair)?: None Help needed to walk in hospital room?: A Little Help needed climbing 3-5 steps with a railing? : A Little 6 Click Score: 20    End of Session Equipment Utilized During Treatment: Gait belt Activity Tolerance: Patient tolerated treatment well Patient left: with call bell/phone within reach;in bed;with family/visitor present Nurse Communication: Mobility status PT Visit Diagnosis: Muscle weakness (generalized) (M62.81);Difficulty in walking, not elsewhere classified (R26.2)     Time: 1100-1117 PT Time Calculation (min) (ACUTE ONLY): 17 min  Charges:    $Gait Training: 8-22 mins PT General Charges $$ ACUTE PT VISIT: 1 Visit                   Lauraine Gills, PTA 05/10/2024, 11:27 AM

## 2024-05-10 NOTE — Anesthesia Postprocedure Evaluation (Signed)
"   Anesthesia Post Note  Patient: Tracey Jensen  Procedure(s) Performed: ARTHROPLASTY, KNEE, TOTAL (Right: Knee)  Patient location during evaluation: Short Stay Anesthesia Type: Spinal Level of consciousness: oriented, awake and alert and awake Pain management: pain level controlled Vital Signs Assessment: post-procedure vital signs reviewed and stable Respiratory status: spontaneous breathing, respiratory function stable and patient connected to nasal cannula oxygen Cardiovascular status: blood pressure returned to baseline and stable Postop Assessment: no headache, no backache and no apparent nausea or vomiting Anesthetic complications: no   No notable events documented.   Last Vitals:  Vitals:   05/10/24 0535 05/10/24 0735  BP: 100/62 (!) 98/50  Pulse: 64 65  Resp: 16 17  Temp:  36.7 C  SpO2: 95% 94%    Last Pain:  Vitals:   05/10/24 0735  TempSrc: Oral  PainSc:                  Jaylene Delon HERO      "

## 2024-05-10 NOTE — TOC Transition Note (Signed)
 Transition of Care [facility]) - Discharge Note   Patient Details  Name: Tracey Jensen MRN: [id number] Date of Birth: 1950-11-01  [facility] CM/SW Contact:  Tracey JAYSON Carpen, LCSW Phone Number: 05/10/2024, 9:07 AM   Clinical Narrative: Patient has orders to discharge home today. CSW left message for [facility] liaison to notify. No further concerns. CSW signing off.    Final next level of care: Home w Home Health Services Barriers to Discharge: Barriers Resolved   Patient Goals and CMS Choice            Discharge Placement                Patient to be transferred to facility by: Husband Name of family member notified: Ubaldo Patient and family notified of of transfer: 05/10/24  Discharge Plan and Services Additional resources added to the After Visit Summary for                            [facility] Arranged: PT, OT [facility] Agency: [facility] Date [facility] Agency Contacted: 05/10/24   Representative spoke with at [facility] Agency: [place]   Social Drivers of Health (SDOH) Interventions SDOH Screenings   Food Insecurity: No Food Insecurity (05/09/2024)  Housing: Low Risk (05/09/2024)  Transportation Needs: No Transportation Needs (05/09/2024)  Utilities: Not At Risk (05/09/2024)  Financial Resource Strain: Low Risk  (04/12/2024)   Received from [facility]  Social Connections: Socially Integrated (05/09/2024)  Tobacco Use: Medium Risk (05/09/2024)     Readmission Risk Interventions     No data to display

## 2024-05-10 NOTE — Plan of Care (Signed)
" °  Problem: Education: Goal: Knowledge of the prescribed therapeutic regimen will improve Outcome: Progressing   Problem: Bowel/Gastric: Goal: Gastrointestinal status for postoperative course will improve Outcome: Progressing   Problem: Cardiac: Goal: Ability to maintain an adequate cardiac output Outcome: Progressing   Problem: Nutritional: Goal: Will attain and maintain optimal nutritional status Outcome: Progressing   Problem: Neurological: Goal: Will regain or maintain usual level of consciousness Outcome: Progressing   Problem: Clinical Measurements: Goal: Ability to maintain clinical measurements within normal limits Outcome: Progressing   "

## 2024-05-12 ENCOUNTER — Other Ambulatory Visit: Payer: Self-pay
